# Patient Record
Sex: Female | Born: 1990 | Race: Black or African American | Hispanic: No | Marital: Single | Smoking: Never smoker
Health system: Southern US, Community
[De-identification: ages and names within clinical notes are randomized; demographics above are authoritative.]

## PROBLEM LIST (undated history)

## (undated) DIAGNOSIS — F32A Depression, unspecified: Secondary | ICD-10-CM

## (undated) DIAGNOSIS — F329 Major depressive disorder, single episode, unspecified: Secondary | ICD-10-CM

---
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
## 2009-03-04 ENCOUNTER — Emergency Department: Payer: Self-pay | Admitting: Emergency Medicine

## 2009-03-19 ENCOUNTER — Emergency Department: Payer: Self-pay | Admitting: Emergency Medicine

## 2009-03-21 ENCOUNTER — Emergency Department: Payer: Self-pay | Admitting: Emergency Medicine

## 2009-07-22 ENCOUNTER — Emergency Department: Payer: Self-pay | Admitting: Internal Medicine

## 2009-08-08 ENCOUNTER — Emergency Department: Payer: Self-pay | Admitting: Emergency Medicine

## 2009-09-28 ENCOUNTER — Emergency Department: Payer: Self-pay | Admitting: Emergency Medicine

## 2009-10-23 ENCOUNTER — Ambulatory Visit: Payer: Self-pay | Admitting: Family Medicine

## 2009-11-07 ENCOUNTER — Observation Stay: Payer: Self-pay | Admitting: Obstetrics and Gynecology

## 2009-11-15 ENCOUNTER — Observation Stay: Payer: Self-pay | Admitting: Obstetrics and Gynecology

## 2009-12-28 ENCOUNTER — Observation Stay: Payer: Self-pay | Admitting: Obstetrics and Gynecology

## 2010-01-31 ENCOUNTER — Inpatient Hospital Stay: Payer: Self-pay | Admitting: Obstetrics and Gynecology

## 2010-02-14 ENCOUNTER — Observation Stay: Payer: Self-pay | Admitting: Obstetrics and Gynecology

## 2010-02-16 ENCOUNTER — Inpatient Hospital Stay: Payer: Self-pay

## 2011-02-01 ENCOUNTER — Emergency Department: Payer: Self-pay | Admitting: Emergency Medicine

## 2011-10-23 ENCOUNTER — Emergency Department: Payer: Self-pay | Admitting: Emergency Medicine

## 2012-05-28 ENCOUNTER — Emergency Department: Payer: Self-pay | Admitting: Emergency Medicine

## 2012-05-28 LAB — CBC WITH DIFFERENTIAL/PLATELET
Basophil %: 0.4 %
Eosinophil #: 0 10*3/uL (ref 0.0–0.7)
Eosinophil %: 0.6 %
HGB: 11.6 g/dL — ABNORMAL LOW (ref 12.0–16.0)
Lymphocyte #: 0.9 10*3/uL — ABNORMAL LOW (ref 1.0–3.6)
MCH: 27.5 pg (ref 26.0–34.0)
MCHC: 31.8 g/dL — ABNORMAL LOW (ref 32.0–36.0)
MCV: 86 fL (ref 80–100)
Monocyte #: 0.3 x10 3/mm (ref 0.2–0.9)
Neutrophil #: 4.2 10*3/uL (ref 1.4–6.5)
RBC: 4.24 10*6/uL (ref 3.80–5.20)
WBC: 5.4 10*3/uL (ref 3.6–11.0)

## 2012-05-28 LAB — COMPREHENSIVE METABOLIC PANEL
Albumin: 3.3 g/dL — ABNORMAL LOW (ref 3.4–5.0)
Anion Gap: 5 — ABNORMAL LOW (ref 7–16)
BUN: 7 mg/dL (ref 7–18)
Calcium, Total: 8.9 mg/dL (ref 8.5–10.1)
EGFR (African American): >60
EGFR (Non-African Amer.): >60
Glucose: 100 mg/dL — ABNORMAL HIGH (ref 65–99)
Osmolality: 277 (ref 275–301)
Potassium: 4.1 mmol/L (ref 3.5–5.1)
SGOT(AST): 19 U/L (ref 15–37)
SGPT (ALT): 13 U/L (ref 12–78)
Total Protein: 7.1 g/dL (ref 6.4–8.2)

## 2012-05-28 LAB — URINALYSIS, COMPLETE
Glucose,UR: NEGATIVE mg/dL (ref 0–75)
Ketone: NEGATIVE
Nitrite: POSITIVE
Ph: 8 (ref 4.5–8.0)
Protein: NEGATIVE
RBC,UR: 1 /HPF (ref 0–5)
WBC UR: 4 /HPF (ref 0–5)

## 2012-05-28 LAB — PREGNANCY, URINE: Pregnancy Test, Urine: NEGATIVE m[IU]/mL

## 2012-10-12 ENCOUNTER — Emergency Department: Payer: Self-pay | Admitting: Emergency Medicine

## 2013-08-27 ENCOUNTER — Emergency Department: Payer: Self-pay | Admitting: Internal Medicine

## 2013-10-29 ENCOUNTER — Emergency Department: Payer: Self-pay | Admitting: Emergency Medicine

## 2013-10-29 LAB — COMPREHENSIVE METABOLIC PANEL
ALK PHOS: 68 U/L
AST: 18 U/L (ref 15–37)
Albumin: 3.5 g/dL (ref 3.4–5.0)
Anion Gap: 3 — ABNORMAL LOW (ref 7–16)
BILIRUBIN TOTAL: 0.4 mg/dL (ref 0.2–1.0)
BUN: 8 mg/dL (ref 7–18)
CREATININE: 0.89 mg/dL (ref 0.60–1.30)
Calcium, Total: 8.4 mg/dL — ABNORMAL LOW (ref 8.5–10.1)
Chloride: 109 mmol/L — ABNORMAL HIGH (ref 98–107)
Co2: 29 mmol/L (ref 21–32)
EGFR (Non-African Amer.): >60
GLUCOSE: 83 mg/dL (ref 65–99)
Osmolality: 279 (ref 275–301)
Potassium: 3.5 mmol/L (ref 3.5–5.1)
SGPT (ALT): 14 U/L (ref 12–78)
Sodium: 141 mmol/L (ref 136–145)
Total Protein: 7 g/dL (ref 6.4–8.2)

## 2013-10-29 LAB — URINALYSIS, COMPLETE
BILIRUBIN, UR: NEGATIVE
Glucose,UR: NEGATIVE mg/dL (ref 0–75)
Ketone: NEGATIVE
Nitrite: POSITIVE
PH: 5 (ref 4.5–8.0)
Protein: 30
SPECIFIC GRAVITY: 1.029 (ref 1.003–1.030)

## 2013-10-29 LAB — LIPASE, BLOOD: Lipase: 66 U/L — ABNORMAL LOW (ref 73–393)

## 2013-10-29 LAB — CBC WITH DIFFERENTIAL/PLATELET
BASOS ABS: 0 10*3/uL (ref 0.0–0.1)
BASOS PCT: 1 %
Eosinophil #: 0.1 10*3/uL (ref 0.0–0.7)
Eosinophil %: 2.4 %
HCT: 35.4 % (ref 35.0–47.0)
HGB: 11.5 g/dL — ABNORMAL LOW (ref 12.0–16.0)
LYMPHS ABS: 1.6 10*3/uL (ref 1.0–3.6)
Lymphocyte %: 33.5 %
MCH: 28.3 pg (ref 26.0–34.0)
MCHC: 32.5 g/dL (ref 32.0–36.0)
MCV: 87 fL (ref 80–100)
Monocyte #: 0.4 x10 3/mm (ref 0.2–0.9)
Monocyte %: 8.3 %
Neutrophil #: 2.6 10*3/uL (ref 1.4–6.5)
Neutrophil %: 54.8 %
Platelet: 194 10*3/uL (ref 150–440)
RBC: 4.07 10*6/uL (ref 3.80–5.20)
RDW: 13.3 % (ref 11.5–14.5)
WBC: 4.8 10*3/uL (ref 3.6–11.0)

## 2013-12-03 ENCOUNTER — Emergency Department: Payer: Self-pay | Admitting: Emergency Medicine

## 2014-04-19 ENCOUNTER — Emergency Department: Payer: Self-pay | Admitting: Emergency Medicine

## 2014-04-19 LAB — BASIC METABOLIC PANEL
Anion Gap: 7 (ref 7–16)
BUN: 8 mg/dL (ref 7–18)
CALCIUM: 8.6 mg/dL (ref 8.5–10.1)
Chloride: 107 mmol/L (ref 98–107)
Co2: 27 mmol/L (ref 21–32)
Creatinine: 0.96 mg/dL (ref 0.60–1.30)
EGFR (African American): >60
GLUCOSE: 79 mg/dL (ref 65–99)
Osmolality: 279 (ref 275–301)
Potassium: 3.6 mmol/L (ref 3.5–5.1)
Sodium: 141 mmol/L (ref 136–145)

## 2014-04-19 LAB — CBC WITH DIFFERENTIAL/PLATELET
BASOS ABS: 0 10*3/uL (ref 0.0–0.1)
Basophil %: 0.6 %
EOS ABS: 0.1 10*3/uL (ref 0.0–0.7)
EOS PCT: 1.4 %
HCT: 36.5 % (ref 35.0–47.0)
HGB: 11.7 g/dL — ABNORMAL LOW (ref 12.0–16.0)
LYMPHS ABS: 1.8 10*3/uL (ref 1.0–3.6)
Lymphocyte %: 34.8 %
MCH: 28 pg (ref 26.0–34.0)
MCHC: 32.1 g/dL (ref 32.0–36.0)
MCV: 87 fL (ref 80–100)
MONO ABS: 0.4 x10 3/mm (ref 0.2–0.9)
Monocyte %: 7.6 %
Neutrophil #: 2.9 10*3/uL (ref 1.4–6.5)
Neutrophil %: 55.6 %
PLATELETS: 194 10*3/uL (ref 150–440)
RBC: 4.18 10*6/uL (ref 3.80–5.20)
RDW: 12.8 % (ref 11.5–14.5)
WBC: 5.3 10*3/uL (ref 3.6–11.0)

## 2014-04-19 LAB — URINALYSIS, COMPLETE
Bilirubin,UR: NEGATIVE
Blood: NEGATIVE
GLUCOSE, UR: NEGATIVE mg/dL (ref 0–75)
Ketone: NEGATIVE
NITRITE: NEGATIVE
Ph: 7 (ref 4.5–8.0)
Protein: NEGATIVE
RBC,UR: 2 /HPF (ref 0–5)
Specific Gravity: 1.013 (ref 1.003–1.030)
WBC UR: 2 /HPF (ref 0–5)

## 2014-04-19 LAB — LIPASE, BLOOD: Lipase: 89 U/L (ref 73–393)

## 2014-04-19 LAB — WET PREP, GENITAL

## 2014-04-20 LAB — GC/CHLAMYDIA PROBE AMP

## 2014-11-03 ENCOUNTER — Emergency Department: Payer: Self-pay | Admitting: Emergency Medicine

## 2014-11-03 LAB — COMPREHENSIVE METABOLIC PANEL
ALK PHOS: 70 U/L (ref 46–116)
Albumin: 3.4 g/dL (ref 3.4–5.0)
Anion Gap: 6 — ABNORMAL LOW (ref 7–16)
BUN: 12 mg/dL (ref 7–18)
Bilirubin,Total: 0.3 mg/dL (ref 0.2–1.0)
CREATININE: 0.88 mg/dL (ref 0.60–1.30)
Calcium, Total: 8.5 mg/dL (ref 8.5–10.1)
Chloride: 107 mmol/L (ref 98–107)
Co2: 27 mmol/L (ref 21–32)
EGFR (African American): >60
EGFR (Non-African Amer.): >60
Glucose: 83 mg/dL (ref 65–99)
Osmolality: 278 (ref 275–301)
Potassium: 3.9 mmol/L (ref 3.5–5.1)
SGOT(AST): 20 U/L (ref 15–37)
SGPT (ALT): 11 U/L — ABNORMAL LOW (ref 14–63)
Sodium: 140 mmol/L (ref 136–145)
TOTAL PROTEIN: 7.2 g/dL (ref 6.4–8.2)

## 2014-11-03 LAB — CBC WITH DIFFERENTIAL/PLATELET
Basophil #: 0 10*3/uL (ref 0.0–0.1)
Basophil %: 0.6 %
Eosinophil #: 0 10*3/uL (ref 0.0–0.7)
Eosinophil %: 0.7 %
HCT: 37.8 % (ref 35.0–47.0)
HGB: 12 g/dL (ref 12.0–16.0)
LYMPHS ABS: 2 10*3/uL (ref 1.0–3.6)
Lymphocyte %: 36.5 %
MCH: 27.7 pg (ref 26.0–34.0)
MCHC: 31.7 g/dL — ABNORMAL LOW (ref 32.0–36.0)
MCV: 87 fL (ref 80–100)
MONOS PCT: 8.4 %
Monocyte #: 0.5 x10 3/mm (ref 0.2–0.9)
NEUTROS ABS: 3 10*3/uL (ref 1.4–6.5)
Neutrophil %: 53.8 %
Platelet: 196 10*3/uL (ref 150–440)
RBC: 4.34 10*6/uL (ref 3.80–5.20)
RDW: 13 % (ref 11.5–14.5)
WBC: 5.5 10*3/uL (ref 3.6–11.0)

## 2014-11-03 LAB — URINALYSIS, COMPLETE
BILIRUBIN, UR: NEGATIVE
Blood: NEGATIVE
GLUCOSE, UR: NEGATIVE mg/dL (ref 0–75)
Ketone: NEGATIVE
Nitrite: POSITIVE
Ph: 6 (ref 4.5–8.0)
Protein: NEGATIVE
RBC,UR: 5 /HPF (ref 0–5)
Specific Gravity: 1.028 (ref 1.003–1.030)
WBC UR: 14 /HPF (ref 0–5)

## 2014-12-07 ENCOUNTER — Emergency Department: Payer: Self-pay | Admitting: Emergency Medicine

## 2015-01-17 ENCOUNTER — Emergency Department: Admit: 2015-01-17 | Disposition: A | Discharge status: Home / Self Care | Payer: Self-pay | Admitting: Student

## 2015-01-17 LAB — COMPREHENSIVE METABOLIC PANEL
ALT: 11 U/L — AB
AST: 18 U/L
Albumin: 3.8 g/dL
Alkaline Phosphatase: 59 U/L
Anion Gap: 5 — ABNORMAL LOW (ref 7–16)
BUN: 12 mg/dL
Bilirubin,Total: 0.7 mg/dL
CALCIUM: 8.8 mg/dL — AB
CO2: 26 mmol/L
Chloride: 107 mmol/L
Creatinine: 0.81 mg/dL
EGFR (African American): >60
EGFR (Non-African Amer.): >60
GLUCOSE: 98 mg/dL
POTASSIUM: 4.1 mmol/L
SODIUM: 138 mmol/L
TOTAL PROTEIN: 7 g/dL

## 2015-01-17 LAB — URINALYSIS, COMPLETE
BILIRUBIN, UR: NEGATIVE
Blood: NEGATIVE
Glucose,UR: NEGATIVE mg/dL (ref 0–75)
Ketone: NEGATIVE
Nitrite: NEGATIVE
PH: 7 (ref 4.5–8.0)
PROTEIN: NEGATIVE
Specific Gravity: 1.024 (ref 1.003–1.030)

## 2015-01-17 LAB — WET PREP, GENITAL

## 2015-01-17 LAB — CBC
HCT: 38.6 % (ref 35.0–47.0)
HGB: 12.2 g/dL (ref 12.0–16.0)
MCH: 27.5 pg (ref 26.0–34.0)
MCHC: 31.7 g/dL — ABNORMAL LOW (ref 32.0–36.0)
MCV: 87 fL (ref 80–100)
Platelet: 186 10*3/uL (ref 150–440)
RBC: 4.45 10*6/uL (ref 3.80–5.20)
RDW: 13.2 % (ref 11.5–14.5)
WBC: 4 10*3/uL (ref 3.6–11.0)

## 2015-01-20 LAB — GC/CHLAMYDIA PROBE AMP

## 2015-10-29 ENCOUNTER — Emergency Department: Payer: Self-pay

## 2015-10-29 ENCOUNTER — Emergency Department
Admission: EM | Admit: 2015-10-29 | Discharge: 2015-10-30 | Disposition: A | Discharge status: Home / Self Care | Payer: Self-pay | Attending: Emergency Medicine | Admitting: Emergency Medicine

## 2015-10-29 DIAGNOSIS — R197 Diarrhea, unspecified: Secondary | ICD-10-CM | POA: Insufficient documentation

## 2015-10-29 DIAGNOSIS — N390 Urinary tract infection, site not specified: Secondary | ICD-10-CM | POA: Insufficient documentation

## 2015-10-29 DIAGNOSIS — R109 Unspecified abdominal pain: Secondary | ICD-10-CM | POA: Insufficient documentation

## 2015-10-29 DIAGNOSIS — R1084 Generalized abdominal pain: Secondary | ICD-10-CM

## 2015-10-29 DIAGNOSIS — R112 Nausea with vomiting, unspecified: Secondary | ICD-10-CM | POA: Insufficient documentation

## 2015-10-29 DIAGNOSIS — Z3202 Encounter for pregnancy test, result negative: Secondary | ICD-10-CM | POA: Insufficient documentation

## 2015-10-29 LAB — URINALYSIS COMPLETE WITH MICROSCOPIC (ARMC ONLY)
Bilirubin Urine: NEGATIVE
Glucose, UA: NEGATIVE mg/dL
Hgb urine dipstick: NEGATIVE
Nitrite: NEGATIVE
PROTEIN: 30 mg/dL — AB
Specific Gravity, Urine: 1.026 (ref 1.005–1.030)
pH: 7 (ref 5.0–8.0)

## 2015-10-29 LAB — TYPE AND SCREEN
ABO/RH(D): O POS
Antibody Screen: NEGATIVE

## 2015-10-29 LAB — COMPREHENSIVE METABOLIC PANEL
ALT: 10 U/L — AB (ref 14–54)
AST: 17 U/L (ref 15–41)
Albumin: 4.1 g/dL (ref 3.5–5.0)
Alkaline Phosphatase: 57 U/L (ref 38–126)
Anion gap: 5 (ref 5–15)
BUN: 10 mg/dL (ref 6–20)
CHLORIDE: 104 mmol/L (ref 101–111)
CO2: 27 mmol/L (ref 22–32)
Calcium: 8.8 mg/dL — ABNORMAL LOW (ref 8.9–10.3)
Creatinine, Ser: 0.84 mg/dL (ref 0.44–1.00)
GFR calc Af Amer: >60 mL/min (ref >60)
GFR calc non Af Amer: >60 mL/min (ref >60)
Glucose, Bld: 95 mg/dL (ref 65–99)
Potassium: 4 mmol/L (ref 3.5–5.1)
Sodium: 136 mmol/L (ref 135–145)
Total Bilirubin: 0.8 mg/dL (ref 0.3–1.2)
Total Protein: 7.6 g/dL (ref 6.5–8.1)

## 2015-10-29 LAB — CBC
HCT: 38.7 % (ref 35.0–47.0)
Hemoglobin: 12.8 g/dL (ref 12.0–16.0)
MCH: 27.8 pg (ref 26.0–34.0)
MCHC: 33 g/dL (ref 32.0–36.0)
MCV: 84.2 fL (ref 80.0–100.0)
PLATELETS: 236 10*3/uL (ref 150–440)
RBC: 4.59 MIL/uL (ref 3.80–5.20)
RDW: 13.4 % (ref 11.5–14.5)
WBC: 10.5 10*3/uL (ref 3.6–11.0)

## 2015-10-29 LAB — PREGNANCY, URINE: Preg Test, Ur: NEGATIVE

## 2015-10-29 LAB — ABO/RH: ABO/RH(D): O POS

## 2015-10-29 MED ORDER — ONDANSETRON 4 MG PO TBDP
ORAL_TABLET | ORAL | Status: AC
  Filled 2015-10-29: qty 1

## 2015-10-29 MED ORDER — ONDANSETRON HCL 4 MG/2ML IJ SOLN
4.0000 mg | Freq: Once | INTRAMUSCULAR | Status: AC
  Administered 2015-10-29: 4 mg via INTRAVENOUS
  Filled 2015-10-29: qty 2

## 2015-10-29 MED ORDER — ONDANSETRON 4 MG PO TBDP
4.0000 mg | ORAL_TABLET | Freq: Once | ORAL | Status: AC
  Administered 2015-10-29: 4 mg via ORAL

## 2015-10-29 MED ORDER — GI COCKTAIL ~~LOC~~: 30.0000 mL | Freq: Once | ORAL | Status: DC

## 2015-10-29 MED ORDER — OMEPRAZOLE 40 MG PO CPDR: 40.0000 mg | DELAYED_RELEASE_CAPSULE | Freq: Every day | ORAL | Status: DC

## 2015-10-29 MED ORDER — FAMOTIDINE IN NACL 20-0.9 MG/50ML-% IV SOLN
20.0000 mg | Freq: Once | INTRAVENOUS | Status: AC
  Administered 2015-10-29: 20 mg via INTRAVENOUS
  Filled 2015-10-29: qty 50

## 2015-10-29 MED ORDER — METOCLOPRAMIDE HCL 10 MG PO TABS: 10.0000 mg | ORAL_TABLET | Freq: Four times a day (QID) | ORAL | Status: DC | PRN

## 2015-10-29 MED ORDER — CEPHALEXIN 500 MG PO CAPS
500.0000 mg | ORAL_CAPSULE | Freq: Once | ORAL | Status: AC
  Administered 2015-10-30: 500 mg via ORAL
  Filled 2015-10-29: qty 1

## 2015-10-29 MED ORDER — SODIUM CHLORIDE 0.9 % IV BOLUS (SEPSIS)
1000.0000 mL | Freq: Once | INTRAVENOUS | Status: AC
  Administered 2015-10-29: 1000 mL via INTRAVENOUS

## 2015-10-29 MED ORDER — CEPHALEXIN 500 MG PO CAPS: 500.0000 mg | ORAL_CAPSULE | Freq: Three times a day (TID) | ORAL | Status: AC

## 2015-10-29 NOTE — ED Provider Notes (Signed)
[facility] Emergency Department Provider Note  ____________________________________________  Time seen: Approximately 1010 PM  I have reviewed the triage vital signs and the nursing notes.   HISTORY  Chief Complaint Hematemesis    HPI MIKYA DON is a 25 y.o. female without any chronic medical conditions who is presenting today with multiple episodes of vomiting as well as 2 episodes of diarrhea.  The patient says that she was having a strange, sick feeling at midday and then went home and vomited. After she vomited she began to have diffuse abdominal cramping without radiation. Says she continues to have diffuse abdominal cramping. Also with 3 days of burning upon urination. No history of urinary tract infections. Says also 2 episodes of diarrhea which were nonbloody. The last episode of vomiting she had she said that there were streaks of blood. She said that she did not vomit forcefully. She denies any chest pain at this time. Says that she does have some instances of heartburn in the past. Says that her sister's concerned that she may have a "ulcer." Denies any known sick contacts. Denies any fever today. Denies any recent antibiotics or hospitalizations.  Denies any associated cough or runny nose. Denies any blood in her stool. Says that she has vomited over 10 times today.   No past medical history on file.  There are no active problems to display for this patient.   No past surgical history on file.  No current outpatient prescriptions on file.  Allergies Review of patient's allergies indicates no known allergies.  No family history on file.  Social History Social History  Substance Use Topics  . Smoking status: Not on file  . Smokeless tobacco: Not on file  . Alcohol Use: Not on file    Review of Systems Constitutional: No fever/chills Eyes: No visual changes. ENT: No sore throat. Cardiovascular: Denies chest pain. Respiratory: Denies  shortness of breath. Gastrointestinal: No constipation. Genitourinary: Negative for dysuria. Musculoskeletal: Negative for back pain. Skin: Negative for rash. Neurological: Negative for headaches, focal weakness or numbness.  10-point ROS otherwise negative.  ____________________________________________   PHYSICAL EXAM:  VITAL SIGNS: ED Triage Vitals  Enc Vitals Group     BP 10/29/15 2030 94/59 mmHg     Pulse Rate 10/29/15 2030 92     Resp 10/29/15 2030 18     Temp 10/29/15 2030 98.2 F (36.8 C)     Temp Source 10/29/15 2030 Oral     SpO2 10/29/15 2030 100 %     Weight 10/29/15 2030 160 lb (72.576 kg)     Height 10/29/15 2030  (1.626 m)     Head Cir --      Peak Flow --      Pain Score 10/29/15 2031 10     Pain Loc --      Pain Edu? --      Excl. in GC? --     Constitutional: Alert and oriented. Well appearing and in no acute distress. Eyes: Conjunctivae are normal. PERRL. EOMI. Head: Atraumatic. Nose: No congestion/rhinnorhea. Mouth/Throat: Mucous membranes are moist.   Neck: No stridor.   Cardiovascular: Normal rate, regular rhythm. Grossly normal heart sounds.  Good peripheral circulation. Respiratory: Normal respiratory effort.  No retractions. Lungs CTAB. Gastrointestinal: Soft and nontender. No distention. No abdominal bruits. No CVA tenderness. Musculoskeletal: No lower extremity tenderness nor edema.  No joint effusions. Neurologic:  Normal speech and language. No gross focal neurologic deficits are appreciated. No gait instability.  Skin:  Skin is warm, dry and intact. No rash noted. Psychiatric: Mood and affect are normal. Speech and behavior are normal.  ____________________________________________   LABS (all labs ordered are listed, but only abnormal results are displayed)  Labs Reviewed  COMPREHENSIVE METABOLIC PANEL - Abnormal; Notable for the following:    Calcium 8.8 (*)    ALT 10 (*)    All other components within normal limits   URINALYSIS COMPLETEWITH MICROSCOPIC ([facility] ONLY) - Abnormal; Notable for the following:    Color, Urine YELLOW (*)    APPearance HAZY (*)    Ketones, ur 1+ (*)    Protein, ur 30 (*)    Leukocytes, UA 1+ (*)    Bacteria, UA RARE (*)    Squamous Epithelial / LPF 6-30 (*)    All other components within normal limits  URINE CULTURE  CBC  PREGNANCY, URINE  TYPE AND SCREEN  ABO/RH   ____________________________________________  EKG   ____________________________________________  RADIOLOGY  No acute cardiopulmonary process seen on chest x-ray. ____________________________________________   PROCEDURES   ____________________________________________   INITIAL IMPRESSION / ASSESSMENT AND PLAN / ED COURSE  Pertinent labs & imaging results that were available during my care of the patient were reviewed by me and considered in my medical decision making (see chart for details).  ----------------------------------------- 11:29 PM on 10/29/2015 -----------------------------------------  Patient resting comfortably at this time with no further episodes of vomiting.  Her IV is running at this time and she is receiving her medications. We'll treat for urinary tract infection. Likely viral etiology. Signed out to Dr. Zenda Alpers. ____________________________________________   FINAL CLINICAL IMPRESSION(S) / ED DIAGNOSES  Abdominal pain. Nausea vomiting and diarrhea.    Myrna Blazer, MD 10/29/15 [phone]

## 2015-10-29 NOTE — Discharge Instructions (Signed)

## 2015-10-29 NOTE — ED Notes (Signed)
Pt reports vomiting all day, states that she has seen blood in her vomit multiple times, pt also reports nausea as well as diarrhea

## 2015-10-30 MED ORDER — SODIUM CHLORIDE 0.9 % IV BOLUS (SEPSIS)
1000.0000 mL | Freq: Once | INTRAVENOUS | Status: AC
  Administered 2015-10-30: 1000 mL via INTRAVENOUS

## 2015-10-30 MED ORDER — METOCLOPRAMIDE HCL 5 MG/ML IJ SOLN
INTRAMUSCULAR | Status: AC
  Filled 2015-10-30: qty 2

## 2015-10-30 MED ORDER — METOCLOPRAMIDE HCL 5 MG/ML IJ SOLN
10.0000 mg | Freq: Once | INTRAMUSCULAR | Status: AC
  Administered 2015-10-30: 10 mg via INTRAVENOUS

## 2015-10-30 NOTE — ED Notes (Signed)
Pt up to urinate. 

## 2015-10-30 NOTE — ED Notes (Signed)
Pt has taken a few sips of water, will continue po challenge.

## 2015-10-30 NOTE — ED Notes (Signed)
Pt awoken from sleep to try po challenge.

## 2015-10-30 NOTE — ED Provider Notes (Signed)
-----------------------------------------   3:38 AM on 10/30/2015 -----------------------------------------   Blood pressure 100/54, pulse 72, temperature 98.2 F (36.8 C), temperature source Oral, resp. rate 12, height  (1.626 m), weight 160 lb (72.576 kg), last menstrual period 10/08/2015, SpO2 98 %.  Assuming care from Dr. Pershing Proud.  In short, Cathy Joseph is a 25 y.o. female with a chief complaint of Hematemesis .  Refer to the original H&P for additional details.  The current plan of care is to reassess the patient.  The patient after initial doses of medication did have an episode of emesis. I gave her a second liter of normal saline as well as some Reglan and she was able to keep down some fluids. It appears that the patient does have a UTI and she will receive her prescription for Keflex originally written by Dr. Pershing Proud. The patient will be discharged to home to follow-up with her primary care physician.   Rebecka Apley, MD 10/30/15 [phone]

## 2015-10-30 NOTE — ED Notes (Signed)
Pt vomiting, md notified.

## 2015-10-30 NOTE — ED Notes (Signed)
Pt sleeping. 

## 2015-10-31 LAB — URINE CULTURE

## 2015-12-24 ENCOUNTER — Encounter: Payer: Self-pay | Admitting: Emergency Medicine

## 2015-12-24 ENCOUNTER — Emergency Department
Admission: EM | Admit: 2015-12-24 | Discharge: 2015-12-24 | Disposition: A | Discharge status: Home / Self Care | Payer: Self-pay | Attending: Emergency Medicine | Admitting: Emergency Medicine

## 2015-12-24 DIAGNOSIS — S30860A Insect bite (nonvenomous) of lower back and pelvis, initial encounter: Secondary | ICD-10-CM | POA: Insufficient documentation

## 2015-12-24 DIAGNOSIS — Y999 Unspecified external cause status: Secondary | ICD-10-CM | POA: Insufficient documentation

## 2015-12-24 DIAGNOSIS — F329 Major depressive disorder, single episode, unspecified: Secondary | ICD-10-CM | POA: Insufficient documentation

## 2015-12-24 DIAGNOSIS — Y9259 Other trade areas as the place of occurrence of the external cause: Secondary | ICD-10-CM | POA: Insufficient documentation

## 2015-12-24 DIAGNOSIS — Y939 Activity, unspecified: Secondary | ICD-10-CM | POA: Insufficient documentation

## 2015-12-24 DIAGNOSIS — S30861A Insect bite (nonvenomous) of abdominal wall, initial encounter: Secondary | ICD-10-CM | POA: Insufficient documentation

## 2015-12-24 DIAGNOSIS — W57XXXA Bitten or stung by nonvenomous insect and other nonvenomous arthropods, initial encounter: Secondary | ICD-10-CM | POA: Insufficient documentation

## 2015-12-24 HISTORY — DX: Major depressive disorder, single episode, unspecified: F32.9

## 2015-12-24 HISTORY — DX: Depression, unspecified: F32.A

## 2015-12-24 MED ORDER — DIPHENHYDRAMINE-ZINC ACETATE 2-0.1 % EX STCK: CUTANEOUS | Status: DC

## 2015-12-24 NOTE — ED Notes (Signed)
Pt states was exposed to bed bugs, bitten and now has itching to areas of bites. Pt in no acute distress.

## 2015-12-24 NOTE — ED Provider Notes (Signed)
[facility] Emergency Department Provider Note  ____________________________________________   I have reviewed the triage vital signs and the nursing notes.   HISTORY  Chief Complaint Pruritis and Insect Bite    HPI Cathy Joseph is a 25 y.[facility]. [facility] that she had bed bugs on her. She was bitten she states on her abdomen and back and a few on her arms. She is now having itching. Multiple other people from the same hotel have been here for the same complaint today.  Past Medical History  Diagnosis Date  . Depression     There are no active problems to display for this patient.   History reviewed. No pertinent past surgical history.  Current Outpatient Rx  Name  Route  Sig  Dispense  Refill  . metoCLOPramide (REGLAN) 10 MG tablet   Oral   Take 1 tablet (10 mg total) by mouth every 6 (six) hours as needed for nausea or vomiting.   12 tablet   0   . omeprazole (PRILOSEC) 40 MG capsule   Oral   Take 1 capsule (40 mg total) by mouth daily.   30 capsule   0     Allergies Review of patient's allergies indicates no known allergies.  No family history on file.  Social History Social History  Substance Use Topics  . Smoking status: Never Smoker   . Smokeless tobacco: Never Used  . Alcohol Use: No    Review of Systems See history of present illness otherwise negative  ____________________________________________   PHYSICAL EXAM:  VITAL SIGNS: ED Triage Vitals  Enc Vitals Group     BP 12/24/15 0347 129/68 mmHg     Pulse Rate 12/24/15 0347 84     Resp 12/24/15 0347 16     Temp 12/24/15 0347 98.2 F (36.8 C)     Temp Source 12/24/15 0347 Oral     SpO2 12/24/15 0347 100 %     Weight 12/24/15 0347 190 lb (86.183 kg)     Height 12/24/15 0347 5\' 3"  (1.6 m)     Head Cir --      Peak Flow --      Pain Score --      Pain Loc --      Pain Edu? --      Excl. in GC? --     Constitutional: Alert and  oriented. Well appearing and in no acute distress. Eyes: Conjunctivae are normal. PERRL. EOMI.  Nose: No congestion/rhinnorhea. Mouth/Throat: Mucous membranes are moist.  Oropharynx non-erythematous. Neck: No stridor.   Nontender with no meningismus Respiratory: Normal respiratory effort.  No retractions. Lungs CTAB. Skin:  Skin is warm, dry and intact. Small raised. It areas on abdomen and back and to some extent arms noted, consistent with insect bites. Blanchable. Psychiatric: Mood and affect are normal. Speech and behavior are normal.  ____________________________________________   LABS (all labs ordered are listed, but only abnormal results are displayed)  Labs Reviewed - No data to display ____________________________________________  EKG  I personally interpreted any EKGs ordered by me or triage  ____________________________________________  RADIOLOGY  I reviewed any imaging ordered by me or triage that were performed during my shift and, if possible, patient and/or family made aware of any abnormal findings. ____________________________________________   PROCEDURES  Procedure(s) performed: None  Critical Care performed: None  ____________________________________________   INITIAL IMPRESSION / ASSESSMENT AND PLAN / ED COURSE  Pertinent labs & imaging  results that were available during my care of the patient were reviewed by me and considered in my medical decision making (see chart for details).  A sheet with bed bug exposure and likely bites. No evidence of anaphylaxis or other acute pathology such as Stevens-Johnson's. Supportive care indicated. Return precautions given. ____________________________________________   FINAL CLINICAL IMPRESSION(S) / ED DIAGNOSES  Final diagnoses:  None      This chart was dictated using voice recognition software.  Despite best efforts to proofread,  errors can occur which can change meaning.     Jeanmarie Plant,  MD 12/24/15 [phone]

## 2015-12-24 NOTE — ED Notes (Signed)
Pt states felt "something crawling on me", woke up and found bed bugs crawling across her legs and abd. Pt itching to abd wall noted.

## 2016-07-24 ENCOUNTER — Encounter: Payer: Self-pay | Admitting: Emergency Medicine

## 2016-07-24 ENCOUNTER — Emergency Department: Payer: Self-pay

## 2016-07-24 DIAGNOSIS — M7042 Prepatellar bursitis, left knee: Secondary | ICD-10-CM | POA: Insufficient documentation

## 2016-07-24 DIAGNOSIS — Z79899 Other long term (current) drug therapy: Secondary | ICD-10-CM | POA: Insufficient documentation

## 2016-07-24 DIAGNOSIS — Y939 Activity, unspecified: Secondary | ICD-10-CM | POA: Insufficient documentation

## 2016-07-24 NOTE — ED Triage Notes (Signed)
Patient ambulatory to triage with steady gait, without difficulty or distress noted; Pt reports left knee x 1-2 weeks with no known injury

## 2016-07-25 ENCOUNTER — Emergency Department
Admission: EM | Admit: 2016-07-25 | Discharge: 2016-07-25 | Disposition: A | Discharge status: Home / Self Care | Payer: Self-pay | Attending: Emergency Medicine | Admitting: Emergency Medicine

## 2016-07-25 DIAGNOSIS — M7052 Other bursitis of knee, left knee: Secondary | ICD-10-CM

## 2016-07-25 DIAGNOSIS — M25562 Pain in left knee: Secondary | ICD-10-CM

## 2016-07-25 MED ORDER — HYDROCODONE-ACETAMINOPHEN 5-325 MG PO TABS
1.0000 | ORAL_TABLET | Freq: Once | ORAL | Status: AC
  Administered 2016-07-25: 1 via ORAL
  Filled 2016-07-25: qty 1

## 2016-07-25 MED ORDER — HYDROCODONE-ACETAMINOPHEN 5-325 MG PO TABS: 1.0000 | ORAL_TABLET | Freq: Four times a day (QID) | ORAL | 0 refills | Status: DC | PRN

## 2016-07-25 MED ORDER — IBUPROFEN 800 MG PO TABS: 800.0000 mg | ORAL_TABLET | Freq: Three times a day (TID) | ORAL | 0 refills | Status: DC | PRN

## 2016-07-25 MED ORDER — KETOROLAC TROMETHAMINE 30 MG/ML IJ SOLN
60.0000 mg | Freq: Once | INTRAMUSCULAR | Status: AC
  Administered 2016-07-25: 60 mg via INTRAMUSCULAR
  Filled 2016-07-25: qty 2

## 2016-07-25 NOTE — ED Provider Notes (Signed)
[facility] Emergency Department Provider Note   ____________________________________________   First MD Initiated Contact with Patient 07/25/16 0128     (approximate)  I have reviewed the triage vital signs and the nursing notes.   HISTORY  Chief Complaint Knee Pain    HPI Cathy Joseph is a 25 y.o. female who presents to the ED from home with a chief complaint of left knee pain. Patient reports nontraumatic left knee pain x 2-3 weeks. Presents this morning secondary to increased pain. Denies associated fever, chills, chest pain, shortness of breath, abdominal pain, nausea, vomiting, diarrhea, numbness, tingling, extremity weakness. Denies recent travel or trauma. Nothing makes her pain better. Movement makes her pain worse.   Past Medical History:  Diagnosis Date  . Depression     There are no active problems to display for this patient.   No past surgical history on file.  Prior to Admission medications   Medication Sig Start Date End Date Taking? Authorizing Provider  Diphenhydramine-Zinc Acetate (BENADRYL ITCH RELIEF STICK) 2-0.1 % STCK Apply to areas of concern twice a day as needed 12/24/15   Jeanmarie PlantJames A McShane, MD  HYDROcodone-acetaminophen (NORCO) 5-325 MG tablet Take 1 tablet by mouth every 6 (six) hours as needed for moderate pain. 07/25/16   Irean HongJade J Sung, MD  ibuprofen (ADVIL,MOTRIN) 800 MG tablet Take 1 tablet (800 mg total) by mouth every 8 (eight) hours as needed for moderate pain. 07/25/16   Irean HongJade J Sung, MD  metoCLOPramide (REGLAN) 10 MG tablet Take 1 tablet (10 mg total) by mouth every 6 (six) hours as needed for nausea or vomiting. 10/29/15   Myrna Blazeravid Matthew Schaevitz, MD  omeprazole (PRILOSEC) 40 MG capsule Take 1 capsule (40 mg total) by mouth daily. 10/29/15 10/28/16  Myrna Blazeravid Matthew Schaevitz, MD    Allergies Review of patient's allergies indicates no known allergies.  No family history on file.  Social History Social History    Substance Use Topics  . Smoking status: Never Smoker  . Smokeless tobacco: Never Used  . Alcohol use No    Review of Systems  Constitutional: No fever/chills. Eyes: No visual changes. ENT: No sore throat. Cardiovascular: Denies chest pain. Respiratory: Denies shortness of breath. Gastrointestinal: No abdominal pain.  No nausea, no vomiting.  No diarrhea.  No constipation. Genitourinary: Negative for dysuria. Musculoskeletal: Positive for knee pain. Negative for back pain. Skin: Negative for rash. Neurological: Negative for headaches, focal weakness or numbness.  10-point ROS otherwise negative.  ____________________________________________   PHYSICAL EXAM:  VITAL SIGNS: ED Triage Vitals  Enc Vitals Group     BP 07/24/16 2241 113/66     Pulse Rate 07/24/16 2241 72     Resp 07/24/16 2241 18     Temp 07/24/16 2241 98.2 F (36.8 C)     Temp Source 07/24/16 2241 Oral     SpO2 07/24/16 2241 100 %     Weight 07/24/16 2240 269 lb (122 kg)     Height 07/24/16 2240 5\' 1"  (1.549 m)     Head Circumference --      Peak Flow --      Pain Score 07/24/16 2239 10     Pain Loc --      Pain Edu? --      Excl. in GC? --     Constitutional: Asleep, awakened for exam. Alert and oriented. Well appearing and in no acute distress. Eyes: Conjunctivae are normal. PERRL. EOMI. Head: Atraumatic. Nose: No congestion/rhinnorhea. Mouth/Throat: Mucous  membranes are moist.  Oropharynx non-erythematous. Neck: No stridor.   Cardiovascular: Normal rate, regular rhythm. Grossly normal heart sounds.  Good peripheral circulation. Respiratory: Normal respiratory effort.  No retractions. Lungs CTAB. Gastrointestinal: Soft and nontender. No distention. No abdominal bruits. No CVA tenderness. Musculoskeletal: Left lateral knee tender to palpation. Limited range of motion secondary to pain. Mild to moderate joint effusion. There is no warmth or erythema to suggest septic joint. Calf is supple without  evidence for compartment syndrome. Limb is symmetrically warm without evidence for ischemia. 2+ distal pulses. Brisk, less than 5 second capillary refill. Neurologic:  Normal speech and language. No gross focal neurologic deficits are appreciated.  Skin:  Skin is warm, dry and intact. No rash noted. Psychiatric: Mood and affect are normal. Speech and behavior are normal.  ____________________________________________   LABS (all labs ordered are listed, but only abnormal results are displayed)  Labs Reviewed - No data to display ____________________________________________  EKG  None ____________________________________________  RADIOLOGY  Left knee x-rays (reviewed by me, interpreted per Dr. Gwenyth Bender): Negative. ____________________________________________   PROCEDURES  Procedure(s) performed: None  Procedures  Critical Care performed: No  ____________________________________________   INITIAL IMPRESSION / ASSESSMENT AND PLAN / ED COURSE  Pertinent labs & imaging results that were available during my care of the patient were reviewed by me and considered in my medical decision making (see chart for details).  25 year old female who presents with a 2-3 week history of nontraumatic left knee pain. Knee is swollen with effusion; clinically most consistent with bursitis. Will apply Ace wrap, crutches, NSAIDs, analgesia and encouraged patient to follow-up with orthopedics next week. Strict return precautions given. Patient verbalizes understanding and agrees with plan of care.  Clinical Course     ____________________________________________   FINAL CLINICAL IMPRESSION(S) / ED DIAGNOSES  Final diagnoses:  Acute pain of left knee  Suprapatellar bursitis of left knee      NEW MEDICATIONS STARTED DURING THIS VISIT:  New Prescriptions   HYDROCODONE-ACETAMINOPHEN (NORCO) 5-325 MG TABLET    Take 1 tablet by mouth every 6 (six) hours as needed for moderate pain.    IBUPROFEN (ADVIL,MOTRIN) 800 MG TABLET    Take 1 tablet (800 mg total) by mouth every 8 (eight) hours as needed for moderate pain.     Note:  This document was prepared using Dragon voice recognition software and may include unintentional dictation errors.    Irean Hong, MD 07/25/16 [phone]

## 2016-07-25 NOTE — Discharge Instructions (Signed)
1. You may take pain medicines as needed (Motrin/Norco #15). 2. Use crutches to walk. You may remove Ace wrap to bathe and sleep. Elevate affected area several times daily. 3. Return to the ER for worsening symptoms, persistent vomiting, difficulty breathing or other concerns.

## 2016-10-03 ENCOUNTER — Emergency Department
Admission: EM | Admit: 2016-10-03 | Discharge: 2016-10-03 | Disposition: A | Discharge status: Home / Self Care | Payer: Medicaid Other | Attending: Emergency Medicine | Admitting: Emergency Medicine

## 2016-10-03 DIAGNOSIS — Z791 Long term (current) use of non-steroidal anti-inflammatories (NSAID): Secondary | ICD-10-CM | POA: Diagnosis not present

## 2016-10-03 DIAGNOSIS — Z79899 Other long term (current) drug therapy: Secondary | ICD-10-CM | POA: Insufficient documentation

## 2016-10-03 DIAGNOSIS — J029 Acute pharyngitis, unspecified: Secondary | ICD-10-CM | POA: Diagnosis present

## 2016-10-03 DIAGNOSIS — J329 Chronic sinusitis, unspecified: Secondary | ICD-10-CM | POA: Insufficient documentation

## 2016-10-03 DIAGNOSIS — B9689 Other specified bacterial agents as the cause of diseases classified elsewhere: Secondary | ICD-10-CM

## 2016-10-03 MED ORDER — AMOXICILLIN-POT CLAVULANATE 875-125 MG PO TABS: 1.0000 | ORAL_TABLET | Freq: Two times a day (BID) | ORAL | 0 refills | Status: DC

## 2016-10-03 MED ORDER — CETIRIZINE HCL 10 MG PO TABS
10.0000 mg | ORAL_TABLET | Freq: Every day | ORAL | 0 refills | Status: DC
Start: 2016-10-03 — End: 2017-01-13

## 2016-10-03 MED ORDER — FLUTICASONE PROPIONATE 50 MCG/ACT NA SUSP: 1.0000 | Freq: Two times a day (BID) | NASAL | 0 refills | Status: DC

## 2016-10-03 MED ORDER — PSEUDOEPH-BROMPHEN-DM 30-2-10 MG/5ML PO SYRP: 10.0000 mL | ORAL_SOLUTION | Freq: Four times a day (QID) | ORAL | 0 refills | Status: DC | PRN

## 2016-10-03 NOTE — ED Provider Notes (Signed)
[facility] Emergency Department Provider Note  ____________________________________________  Time seen: Approximately 4:47 PM  I have reviewed the triage vital signs and the nursing notes.   HISTORY  Chief Complaint Sore Throat    HPI Cathy Joseph is a 25 y.o. female who presents emergency department complaining of nasal congestion, sinus pressure, sinus headache, sore throat, cough 2 weeks. Patient states that symptoms began insidiously and have persisted. Patient is not taking any medications including over-the-counter medications for this complaint prior to arrival. Patient reports significant pressure in the maxillary and frontal sinus region. She denies any ear pain, chest pain, shortness of breath, abdominal pain, nausea or vomiting.   Past Medical History:  Diagnosis Date  . Depression     There are no active problems to display for this patient.   History reviewed. No pertinent surgical history.  Prior to Admission medications   Medication Sig Start Date End Date Taking? Authorizing Provider  amoxicillin-clavulanate (AUGMENTIN) 875-125 MG tablet Take 1 tablet by mouth 2 (two) times daily. 10/03/16   Delorise Royals Ac Colan, PA-C  brompheniramine-pseudoephedrine-DM 30-2-10 MG/5ML syrup Take 10 mLs by mouth 4 (four) times daily as needed. 10/03/16   Delorise Royals Doratha Mcswain, PA-C  cetirizine (ZYRTEC) 10 MG tablet Take 1 tablet (10 mg total) by mouth daily. 10/03/16   Delorise Royals Duwan Adrian, PA-C  Diphenhydramine-Zinc Acetate (BENADRYL ITCH RELIEF STICK) 2-0.1 % STCK Apply to areas of concern twice a day as needed 12/24/15   Jeanmarie Plant, MD  fluticasone (FLONASE) 50 MCG/ACT nasal spray Place 1 spray into both nostrils 2 (two) times daily. 10/03/16   Delorise Royals Dezerae Freiberger, PA-C  HYDROcodone-acetaminophen (NORCO) 5-325 MG tablet Take 1 tablet by mouth every 6 (six) hours as needed for moderate pain. 07/25/16   Irean Hong, MD  ibuprofen (ADVIL,MOTRIN) 800  MG tablet Take 1 tablet (800 mg total) by mouth every 8 (eight) hours as needed for moderate pain. 07/25/16   Irean Hong, MD  metoCLOPramide (REGLAN) 10 MG tablet Take 1 tablet (10 mg total) by mouth every 6 (six) hours as needed for nausea or vomiting. 10/29/15   Myrna Blazer, MD  omeprazole (PRILOSEC) 40 MG capsule Take 1 capsule (40 mg total) by mouth daily. 10/29/15 10/28/16  Myrna Blazer, MD    Allergies Patient has no known allergies.  No family history on file.  Social History Social History  Substance Use Topics  . Smoking status: Never Smoker  . Smokeless tobacco: Never Used  . Alcohol use No     Review of Systems  Constitutional: No fever/chills Eyes: No visual changes. No discharge ENT: Positive for nasal congestion and sore throat. Positive for sinus pressure. Cardiovascular: no chest pain. Respiratory: Positive cough. No SOB. Gastrointestinal: No abdominal pain.  No nausea, no vomiting.   Musculoskeletal: Negative for musculoskeletal pain. Skin: Negative for rash, abrasions, lacerations, ecchymosis. Neurological: Was a sinus headache but denies focal weakness or numbness. 10-point ROS otherwise negative.  ____________________________________________   PHYSICAL EXAM:  VITAL SIGNS: ED Triage Vitals  Enc Vitals Group     BP 10/03/16 1622 121/75     Pulse Rate 10/03/16 1622 83     Resp 10/03/16 1622 18     Temp 10/03/16 1622 97.7 F (36.5 C)     Temp Source 10/03/16 1622 Oral     SpO2 10/03/16 1622 98 %     Weight 10/03/16 1623 180 lb (81.6 kg)     Height 10/03/16 1623 5\' 2"  (  1.575 m)     Head Circumference --      Peak Flow --      Pain Score 10/03/16 1623 10     Pain Loc --      Pain Edu? --      Excl. in GC? --      Constitutional: Alert and oriented. Well appearing and in no acute distress. Eyes: Conjunctivae are normal. PERRL. EOMI. Head: Atraumatic. ENT:      Ears: EACs are unremarkable bilaterally. TMs are mildly bulging  bilaterally. No air-fluid level.      Nose: Moderate purulent congestion/rhinnorhea. Turbinates are erythematous and edematous. Patient is very tender to percussion over bilateral frontal and maxillary sinuses.      Mouth/Throat: Mucous membranes are moist. Oropharynx is mildly erythematous with postnasal drip identified. Uvula is midline. Tonsils are unremarkable bilaterally. Neck: No stridor. Neck is supple with full range of motion Hematological/Lymphatic/Immunilogical: Diffuse, mobile, nontender anterior cervical lymphadenopathy. Cardiovascular: Normal rate, regular rhythm. Normal S1 and S2.  Good peripheral circulation. Respiratory: Normal respiratory effort without tachypnea or retractions. Lungs CTAB. Good air entry to the bases with no decreased or absent breath sounds. Musculoskeletal: Full range of motion to all extremities. No gross deformities appreciated. Neurologic:  Normal speech and language. No gross focal neurologic deficits are appreciated.  Skin:  Skin is warm, dry and intact. No rash noted. Psychiatric: Mood and affect are normal. Speech and behavior are normal. Patient exhibits appropriate insight and judgement.   ____________________________________________   LABS (all labs ordered are listed, but only abnormal results are displayed)  Labs Reviewed - No data to display ____________________________________________  EKG   ____________________________________________  RADIOLOGY   No results found.  ____________________________________________    PROCEDURES  Procedure(s) performed:    Procedures    Medications - No data to display   ____________________________________________   INITIAL IMPRESSION / ASSESSMENT AND PLAN / ED COURSE  Pertinent labs & imaging results that were available during my care of the patient were reviewed by me and considered in my medical decision making (see chart for details).  Review of the [place] CSRS was performed in  accordance of the [facility] prior to dispensing any controlled drugs.  Clinical Course     Patient's diagnosis is consistent with Bacterial sinusitis. No indication for labs or imaging at this time.. Patient will be discharged home with prescriptions for antibiotics, Flonase, Zyrtec, cough syrup. Patient is to follow up with primary care as needed or otherwise directed. Patient is given ED precautions to return to the ED for any worsening or new symptoms.     ____________________________________________  FINAL CLINICAL IMPRESSION(S) / ED DIAGNOSES  Final diagnoses:  Bacterial sinusitis      NEW MEDICATIONS STARTED DURING THIS VISIT:  New Prescriptions   AMOXICILLIN-CLAVULANATE (AUGMENTIN) 875-125 MG TABLET    Take 1 tablet by mouth 2 (two) times daily.   BROMPHENIRAMINE-PSEUDOEPHEDRINE-DM 30-2-10 MG/5ML SYRUP    Take 10 mLs by mouth 4 (four) times daily as needed.   CETIRIZINE (ZYRTEC) 10 MG TABLET    Take 1 tablet (10 mg total) by mouth daily.   FLUTICASONE (FLONASE) 50 MCG/ACT NASAL SPRAY    Place 1 spray into both nostrils 2 (two) times daily.        This chart was dictated using voice recognition software/Dragon. Despite best efforts to proofread, errors can occur which can change the meaning. Any change was purely unintentional.    Racheal PatchesJonathan D Teigan Sahli, PA-C 10/03/16 1702    Onalee Huaavid  Achilles DunkHsienta Yao, MD 10/04/16 [phone]

## 2016-10-03 NOTE — ED Triage Notes (Signed)
Pt c/o sore throat with sinus pain/pressure for the past week

## 2016-10-03 NOTE — ED Notes (Signed)
Pt in via triage with complaints of sinus pain/pressure, congestion, cough, and sore throat x "2-3 weeks."  Pt reports taking nyquil and robitussin at home with out any relief.  Pt denies any fevers.  Pt ambulatory to room, no immediate distress at this time.

## 2016-11-11 ENCOUNTER — Emergency Department
Admission: EM | Admit: 2016-11-11 | Discharge: 2016-11-12 | Disposition: A | Discharge status: Home / Self Care | Payer: Self-pay | Attending: Emergency Medicine | Admitting: Emergency Medicine

## 2016-11-11 ENCOUNTER — Emergency Department: Payer: Self-pay

## 2016-11-11 DIAGNOSIS — R1012 Left upper quadrant pain: Secondary | ICD-10-CM

## 2016-11-11 DIAGNOSIS — K5792 Diverticulitis of intestine, part unspecified, without perforation or abscess without bleeding: Secondary | ICD-10-CM | POA: Insufficient documentation

## 2016-11-11 DIAGNOSIS — K529 Noninfective gastroenteritis and colitis, unspecified: Secondary | ICD-10-CM | POA: Insufficient documentation

## 2016-11-11 DIAGNOSIS — R197 Diarrhea, unspecified: Secondary | ICD-10-CM

## 2016-11-11 DIAGNOSIS — R1110 Vomiting, unspecified: Secondary | ICD-10-CM

## 2016-11-11 LAB — CBC
HCT: 36.6 % (ref 35.0–47.0)
HEMOGLOBIN: 12.1 g/dL (ref 12.0–16.0)
MCH: 27.9 pg (ref 26.0–34.0)
MCHC: 33 g/dL (ref 32.0–36.0)
MCV: 84.6 fL (ref 80.0–100.0)
Platelets: 208 10*3/uL (ref 150–440)
RBC: 4.32 MIL/uL (ref 3.80–5.20)
RDW: 13 % (ref 11.5–14.5)
WBC: 7.7 10*3/uL (ref 3.6–11.0)

## 2016-11-11 LAB — URINALYSIS, COMPLETE (UACMP) WITH MICROSCOPIC
Bilirubin Urine: NEGATIVE
GLUCOSE, UA: NEGATIVE mg/dL
Hgb urine dipstick: NEGATIVE
KETONES UR: 5 mg/dL — AB
Nitrite: NEGATIVE
PROTEIN: 30 mg/dL — AB
Specific Gravity, Urine: 1.029 (ref 1.005–1.030)
pH: 6 (ref 5.0–8.0)

## 2016-11-11 LAB — COMPREHENSIVE METABOLIC PANEL
ALBUMIN: 3.9 g/dL (ref 3.5–5.0)
ALK PHOS: 67 U/L (ref 38–126)
ALT: 24 U/L (ref 14–54)
ANION GAP: 6 (ref 5–15)
AST: 32 U/L (ref 15–41)
BUN: 12 mg/dL (ref 6–20)
CALCIUM: 8.7 mg/dL — AB (ref 8.9–10.3)
CHLORIDE: 103 mmol/L (ref 101–111)
CO2: 27 mmol/L (ref 22–32)
CREATININE: 0.95 mg/dL (ref 0.44–1.00)
GFR calc Af Amer: >60 mL/min (ref >60)
GFR calc non Af Amer: >60 mL/min (ref >60)
GLUCOSE: 94 mg/dL (ref 65–99)
Potassium: 3.8 mmol/L (ref 3.5–5.1)
SODIUM: 136 mmol/L (ref 135–145)
Total Bilirubin: 0.6 mg/dL (ref 0.3–1.2)
Total Protein: 7.9 g/dL (ref 6.5–8.1)

## 2016-11-11 LAB — LIPASE, BLOOD: Lipase: 17 U/L (ref 11–51)

## 2016-11-11 LAB — POCT PREGNANCY, URINE: Preg Test, Ur: NEGATIVE

## 2016-11-11 MED ORDER — MORPHINE SULFATE (PF) 4 MG/ML IV SOLN
INTRAVENOUS | Status: AC
  Administered 2016-11-11: 4 mg via INTRAVENOUS
  Filled 2016-11-11: qty 1

## 2016-11-11 MED ORDER — ONDANSETRON HCL 4 MG/2ML IJ SOLN
INTRAMUSCULAR | Status: AC
  Administered 2016-11-11: 4 mg via INTRAVENOUS
  Filled 2016-11-11: qty 2

## 2016-11-11 MED ORDER — PROMETHAZINE HCL 25 MG/ML IJ SOLN
INTRAMUSCULAR | Status: AC
  Administered 2016-11-11: 12.5 mg via INTRAMUSCULAR
  Filled 2016-11-11: qty 1

## 2016-11-11 MED ORDER — IOPAMIDOL (ISOVUE-300) INJECTION 61%
30.0000 mL | Freq: Once | INTRAVENOUS | Status: AC
Start: 2016-11-11 — End: 2016-11-11
  Administered 2016-11-11: 30 mL via ORAL
  Filled 2016-11-11: qty 30

## 2016-11-11 MED ORDER — ONDANSETRON HCL 4 MG/2ML IJ SOLN
4.0000 mg | Freq: Once | INTRAMUSCULAR | Status: AC
  Administered 2016-11-11: 4 mg via INTRAVENOUS

## 2016-11-11 MED ORDER — SODIUM CHLORIDE 0.9 % IV BOLUS (SEPSIS)
1000.0000 mL | Freq: Once | INTRAVENOUS | Status: AC
Start: 2016-11-11 — End: 2016-11-12
  Administered 2016-11-11: 1000 mL via INTRAVENOUS

## 2016-11-11 MED ORDER — PROMETHAZINE HCL 25 MG/ML IJ SOLN
12.5000 mg | Freq: Once | INTRAMUSCULAR | Status: AC
  Administered 2016-11-11: 12.5 mg via INTRAMUSCULAR

## 2016-11-11 MED ORDER — IOPAMIDOL (ISOVUE-300) INJECTION 61%
100.0000 mL | Freq: Once | INTRAVENOUS | Status: AC | PRN
  Administered 2016-11-12: 100 mL via INTRAVENOUS
  Filled 2016-11-11: qty 100

## 2016-11-11 MED ORDER — MORPHINE SULFATE (PF) 4 MG/ML IV SOLN
4.0000 mg | Freq: Once | INTRAVENOUS | Status: AC
  Administered 2016-11-11: 4 mg via INTRAVENOUS

## 2016-11-11 NOTE — ED Triage Notes (Signed)
Pt started yesterday having lower abd pain with nausea and diarrhea (5-6 loose stools in 24 hours) - pain radiates into back - pt denies pain or frequency with urination

## 2016-11-11 NOTE — ED Notes (Signed)
Pt vomited a large amount of emesis. Pt had drank approx 1.5 bottles of 2 bottles given for CT.

## 2016-11-11 NOTE — ED Provider Notes (Signed)
[facility] Emergency Department Provider Note   ____________________________________________   First MD Initiated Contact with Patient 11/11/16 2217     (approximate)  I have reviewed the triage vital signs and the nursing notes.   HISTORY  Chief Complaint Abdominal Pain    HPI [name] is a 26 y.o. female reports she's been having severe episodes of crampy pain over the mid to left side of the abdomen with nausea and vomiting since yesterday. No fever. Reports she's been having episodes where she'll begin developing crampy pain, that seems to worsen and become very severe to the point she is in tears off and on for the last day and a half. She deniesany significant previous medical history.  At about 5-6 loose, nonbloody stools the last day. Denies any recent travel. No vaginal symptoms. No vaginal discharge. Denies pregnancy. No vaginal bleeding.  No right-sided pain.  No chest pain or shortness of breath.   Past Medical History:  Diagnosis Date  . Depression     There are no active problems to display for this patient.   History reviewed. No pertinent surgical history.  Prior to Admission medications   Medication Sig Start Date End Date Taking? Authorizing Provider  amoxicillin-clavulanate (AUGMENTIN) 875-125 MG tablet Take 1 tablet by mouth 2 (two) times daily. 10/03/16   Delorise Royals Cuthriell, PA-C  brompheniramine-pseudoephedrine-DM 30-2-10 MG/5ML syrup Take 10 mLs by mouth 4 (four) times daily as needed. 10/03/16   Delorise Royals Cuthriell, PA-C  cetirizine (ZYRTEC) 10 MG tablet Take 1 tablet (10 mg total) by mouth daily. 10/03/16   Delorise Royals Cuthriell, PA-C  Diphenhydramine-Zinc Acetate (BENADRYL ITCH RELIEF STICK) 2-0.1 % STCK Apply to areas of concern twice a day as needed 12/24/15   Jeanmarie Plant, MD  fluticasone (FLONASE) 50 MCG/ACT nasal spray Place 1 spray into both nostrils 2 (two) times daily. 10/03/16   Delorise Royals Cuthriell,  PA-C  HYDROcodone-acetaminophen (NORCO) 5-325 MG tablet Take 1 tablet by mouth every 6 (six) hours as needed for moderate pain. 07/25/16   Irean Hong, MD  ibuprofen (ADVIL,MOTRIN) 800 MG tablet Take 1 tablet (800 mg total) by mouth every 8 (eight) hours as needed for moderate pain. 07/25/16   Irean Hong, MD  metoCLOPramide (REGLAN) 10 MG tablet Take 1 tablet (10 mg total) by mouth every 6 (six) hours as needed for nausea or vomiting. 10/29/15   Myrna Blazer, MD  omeprazole (PRILOSEC) 40 MG capsule Take 1 capsule (40 mg total) by mouth daily. 10/29/15 10/28/16  Myrna Blazer, MD    Allergies Patient has no known allergies.  No family history on file.  Social History Social History  Substance Use Topics  . Smoking status: Never Smoker  . Smokeless tobacco: Never Used  . Alcohol use No    Review of Systems Constitutional: No fever/chills Eyes: No visual changes. ENT: No sore throat. Cardiovascular: Denies chest pain. Respiratory: Denies shortness of breath. Gastrointestinal: No constipation. Genitourinary: Negative for dysuria. Musculoskeletal: When she has severe episodes of pain, it seems to radiate towards her back, particularly on the left side Skin: Negative for rash. Neurological: Negative for headaches, focal weakness or numbness.  10-point ROS otherwise negative.  ____________________________________________   PHYSICAL EXAM:  VITAL SIGNS: ED Triage Vitals  Enc Vitals Group     BP 11/11/16 2121 115/67     Pulse Rate 11/11/16 2121 84     Resp 11/11/16 2121 16     Temp 11/11/16  2121 98.2 F (36.8 C)     Temp Source 11/11/16 2121 Oral     SpO2 11/11/16 2121 100 %     Weight 11/11/16 2122 218 lb 6.4 oz (99.1 kg)     Height 11/11/16 2122 5\' 3"  (1.6 m)     Head Circumference --      Peak Flow --      Pain Score 11/11/16 2134 10     Pain Loc --      Pain Edu? --      Excl. in GC? --     Constitutional: Alert and oriented. Patient appears in  moderate pain, somewhat tearful reporting significant cramping pain in the mid abdomen, pointing towards her umbilicus that radiates up to her left upper abdomen at this time. Eyes: Conjunctivae are normal. PERRL. EOMI. Head: Atraumatic. Nose: No congestion/rhinnorhea. Mouth/Throat: Mucous membranes are moist.  Oropharynx non-erythematous. Neck: No stridor.   Cardiovascular: Normal rate, regular rhythm. Grossly normal heart sounds.  Good peripheral circulation. Respiratory: Normal respiratory effort.  No retractions. Lungs CTAB. Gastrointestinal: Soft and moderately tender, especially in the left upper quadrant and left flank without rebound or guarding. Minimal discomfort in the right lower quadrant, but reports primary pain is in the left upper quadrant. No pain in the right upper quadrant. No distention. No abdominal bruits. No CVA tenderness. Musculoskeletal: No lower extremity tenderness nor edema.  No joint effusions. Neurologic:  Normal speech and language. No gross focal neurologic deficits are appreciated. Skin:  Skin is warm, dry and intact. No rash noted. Psychiatric: Mood and affect are normal. Speech and behavior are normal.  ____________________________________________   LABS (all labs ordered are listed, but only abnormal results are displayed)  Labs Reviewed  COMPREHENSIVE METABOLIC PANEL - Abnormal; Notable for the following:       Result Value   Calcium 8.7 (*)    All other components within normal limits  URINALYSIS, COMPLETE (UACMP) WITH MICROSCOPIC - Abnormal; Notable for the following:    Color, Urine YELLOW (*)    APPearance HAZY (*)    Ketones, ur 5 (*)    Protein, ur 30 (*)    Leukocytes, UA TRACE (*)    Bacteria, UA FEW (*)    Squamous Epithelial / LPF 6-30 (*)    All other components within normal limits  URINE CULTURE  LIPASE, BLOOD  CBC  POCT PREGNANCY, URINE   ____________________________________________  EKG  EKG was performed to evaluate QT  interval before giving multiple doses of antiemetics Heart rate 90 QRS 80 QTc 470 Normal sinus rhythm, no evidence of ectopy EKG time 2315 ____________________________________________  RADIOLOGY  CT abdomen and pelvis pending at time of transfer of care ____________________________________________   PROCEDURES  Procedure(s) performed: None  Procedures  Critical Care performed: No  ____________________________________________   INITIAL IMPRESSION / ASSESSMENT AND PLAN / ED COURSE  Pertinent labs & imaging results that were available during my care of the patient were reviewed by me and considered in my medical decision making (see chart for details).  Differential diagnosis includes but is not limited to, abdominal perforation, aortic dissection, cholecystitis, appendicitis, diverticulitis, colitis, esophagitis/gastritis, kidney stone, pyelonephritis, urinary tract infection, etc.. All are considered in decision and treatment plan. Based upon the patient's presentation and risk factors, I'm concerned about the severity of her pain and we'll proceed with CT scan. She denies any gynecologic symptoms or lower abdominal pain, making an acute ovarian processes seem extremely unlikely. In addition she denies any urinary symptoms,  though her urine sample does appear slightly contaminated I will send it for culture.  No chest pain or shortness of breath. Afebrile. Lab work overall reassuring, but given the severity of reported pain, we'll proceed with CT imaging.  ----------------------------------------- 12:07 AM on 11/12/2016 -----------------------------------------  Ongoing care and disposition assigned to Dr. York Cerise. Plan of care is to follow-up on CT abdomen and pelvis as well as reevaluation after hydration and antiemetics. Patient presenting with mid to left upper quadrant abdominal pain, mild-moderate concern for acute intra-abdominal process given presentation. No GYN symptoms  noted, though would consider TVUS is an adenexal process is seen on CT (though low pre-test suspicion) given clinically GI symptoms. Urine culture sent, appears slightly contaminated sample, the CT scan demonstrates any concern for bladder inflammation or nephritis would likely treat.  If patient has no acute or concerning findings on CT, likely discharge to home with prescription for antiemetics such as Zofran, if she is able to tolerate fluids and feels improved.   Clinical Course as of Nov 12 9  Tue Nov 12, 2016  0010 Assuming care from Dr. Fanny Bien.  In short, Cathy Joseph is a 26 y.o. female with a chief complaint of abdominal pain, N/V/D.  Refer to the original H&P for additional details.  The current plan of care is to follow up CT scan and reassess.  Dr. Fanny Bien anticipates outpatient management if CT generally reassuring.  Questionable UTI, urine culture pending.  [CF]    Clinical Course User Index [CF] Loleta Rose, MD     ____________________________________________   FINAL CLINICAL IMPRESSION(S) / ED DIAGNOSES  Final diagnoses:  Left upper quadrant abdominal pain of unknown etiology      NEW MEDICATIONS STARTED DURING THIS VISIT:  New Prescriptions   No medications on file     Note:  This document was prepared using Dragon voice recognition software and may include unintentional dictation errors.     Sharyn Creamer, MD 11/12/16 [phone]

## 2016-11-11 NOTE — ED Notes (Signed)
Pt vomiting after trying to drink oral contrast. MD informed and orders placed.

## 2016-11-12 MED ORDER — METRONIDAZOLE 500 MG PO TABS
500.0000 mg | ORAL_TABLET | Freq: Once | ORAL | Status: AC
Start: 2016-11-12 — End: 2016-11-12
  Administered 2016-11-12: 500 mg via ORAL
  Filled 2016-11-12: qty 1

## 2016-11-12 MED ORDER — CIPROFLOXACIN HCL 500 MG PO TABS: 500.0000 mg | ORAL_TABLET | Freq: Two times a day (BID) | ORAL | 0 refills | Status: AC

## 2016-11-12 MED ORDER — METRONIDAZOLE 500 MG PO TABS: 500.0000 mg | ORAL_TABLET | Freq: Three times a day (TID) | ORAL | 0 refills | Status: AC

## 2016-11-12 MED ORDER — HYDROCODONE-ACETAMINOPHEN 5-325 MG PO TABS: 1.0000 | ORAL_TABLET | ORAL | 0 refills | Status: DC | PRN

## 2016-11-12 MED ORDER — ONDANSETRON 4 MG PO TBDP: 4.0000 mg | ORAL_TABLET | Freq: Four times a day (QID) | ORAL | 0 refills | Status: DC | PRN

## 2016-11-12 MED ORDER — CIPROFLOXACIN HCL 500 MG PO TABS
500.0000 mg | ORAL_TABLET | ORAL | Status: AC
  Administered 2016-11-12: 500 mg via ORAL
  Filled 2016-11-12: qty 1

## 2016-11-12 NOTE — Discharge Instructions (Addendum)
We believe your symptoms are caused by colitis, likely diverticulitis.  Most of the time this condition (please read through the included information) can be cured with outpatient antibiotics.  Please take the full course of prescribed medication(s) and follow up with the doctors recommended above.  Return to the ED if your abdominal pain worsens or fails to improve, you develop bloody vomiting, bloody diarrhea, you are unable to tolerate fluids due to vomiting, fever greater than 101, or other symptoms that concern you.  Take Norco as prescribed for severe pain. Do not drink alcohol, drive or participate in any other potentially dangerous activities while taking this medication as it may make you sleepy. Do not take this medication with any other sedating medications, either prescription or over-the-counter. If you were prescribed Percocet or Vicodin, do not take these with acetaminophen (Tylenol) as it is already contained within these medications.   This medication is an opiate (or narcotic) pain medication and can be habit forming.  Use it as little as possible to achieve adequate pain control.  Do not use or use it with extreme caution if you have a history of opiate abuse or dependence.  If you are on a pain contract with your primary care doctor or a pain specialist, be sure to let them know you were prescribed this medication today from the [facility].  This medication is intended for your use only - do not give any to anyone else and keep it in a secure place where nobody else, especially children, have access to it.  It will also cause or worsen constipation, so you may want to consider taking an over-the-counter stool softener while you are taking this medication.

## 2016-11-12 NOTE — ED Notes (Signed)
Patient transported to CT 

## 2016-11-12 NOTE — ED Notes (Signed)
Report to Kasey, RN  

## 2016-11-15 LAB — URINE CULTURE
Culture: >=100000 — AB
SPECIAL REQUESTS: NORMAL

## 2017-01-13 ENCOUNTER — Emergency Department
Admission: EM | Admit: 2017-01-13 | Discharge: 2017-01-13 | Disposition: A | Discharge status: Home / Self Care | Payer: Self-pay | Attending: Emergency Medicine | Admitting: Emergency Medicine

## 2017-01-13 ENCOUNTER — Emergency Department: Payer: Self-pay

## 2017-01-13 ENCOUNTER — Encounter: Payer: Self-pay | Admitting: Emergency Medicine

## 2017-01-13 DIAGNOSIS — M25562 Pain in left knee: Secondary | ICD-10-CM | POA: Insufficient documentation

## 2017-01-13 DIAGNOSIS — Z79899 Other long term (current) drug therapy: Secondary | ICD-10-CM | POA: Insufficient documentation

## 2017-01-13 MED ORDER — MELOXICAM 15 MG PO TABS: 15.0000 mg | ORAL_TABLET | Freq: Every day | ORAL | 0 refills | Status: DC

## 2017-01-13 MED ORDER — KETOROLAC TROMETHAMINE 60 MG/2ML IM SOLN
60.0000 mg | Freq: Once | INTRAMUSCULAR | Status: AC
  Administered 2017-01-13: 60 mg via INTRAMUSCULAR
  Filled 2017-01-13: qty 2

## 2017-01-13 NOTE — ED Notes (Signed)
See triage note   Left knee pain for about 2 weeks w/o injury  Pain is lateral   Min swelling  Increased pain with ambulation

## 2017-01-13 NOTE — ED Triage Notes (Signed)
Patient presents to the ED with left knee pain x 2 weeks.  Patient denies any known trauma.  Patient states, "It feels like someone is constantly stabbing me in the knee."  Patient reports swelling as well as pain.

## 2017-01-13 NOTE — ED Provider Notes (Signed)
[facility] Emergency Department Provider Note  ____________________________________________  Time seen: Approximately 6:54 PM  I have reviewed the triage vital signs and the nursing notes.   HISTORY  Chief Complaint Knee Pain    HPI Cathy Joseph is a 26 y.o. female who presents emergency department complaining of continuing knee pain. Patient reports that the pain has been ongoing for approximately 6 months. Patient has been seen in this department prior for same complaint. Patient did not follow-up with orthopedics. She reports that it is constant, anterior knee pain. She reports that it is a burning/stabbing/aching/throbbing sensation. She denies any trauma to the area. She denies any numbness or tingling in her left lower extremity. Patient has been placed on anti-inflammatories in the past but states that she has not taken anything prior to arrival.   Past Medical History:  Diagnosis Date  . Depression     There are no active problems to display for this patient.   History reviewed. No pertinent surgical history.  Prior to Admission medications   Medication Sig Start Date End Date Taking? Authorizing Provider  meloxicam (MOBIC) 15 MG tablet Take 1 tablet (15 mg total) by mouth daily. 01/13/17   Delorise Royals Cuthriell, PA-C  omeprazole (PRILOSEC) 40 MG capsule Take 1 capsule (40 mg total) by mouth daily. 10/29/15 10/28/16  Myrna Blazer, MD  ondansetron (ZOFRAN ODT) 4 MG disintegrating tablet Take 1 tablet (4 mg total) by mouth every 6 (six) hours as needed for nausea or vomiting. 11/12/16   Sharyn Creamer, MD    Allergies Patient has no known allergies.  No family history on file.  Social History Social History  Substance Use Topics  . Smoking status: Never Smoker  . Smokeless tobacco: Never Used  . Alcohol use No     Review of Systems  Constitutional: No fever/chills Cardiovascular: no chest pain. Respiratory: no cough. No  SOB. Musculoskeletal: Positive for left knee pain Skin: Negative for rash, abrasions, lacerations, ecchymosis. Neurological: Negative for headaches, focal weakness or numbness. 10-point ROS otherwise negative.  ____________________________________________   PHYSICAL EXAM:  VITAL SIGNS: ED Triage Vitals  Enc Vitals Group     BP 01/13/17 1721 100/63     Pulse Rate 01/13/17 1721 (!) 51     Resp 01/13/17 1721 18     Temp 01/13/17 1721 98 F (36.7 C)     Temp Source 01/13/17 1721 Oral     SpO2 01/13/17 1721 96 %     Weight 01/13/17 1721 220 lb (99.8 kg)     Height 01/13/17 1721  (1.6 m)     Head Circumference --      Peak Flow --      Pain Score 01/13/17 1720 10     Pain Loc --      Pain Edu? --      Excl. in GC? --      Constitutional: Alert and oriented. Well appearing and in no acute distress. Eyes: Conjunctivae are normal. PERRL. EOMI. Head: Atraumatic. Neck: No stridor.    Cardiovascular: Normal rate, regular rhythm. Normal S1 and S2.  Good peripheral circulation. Respiratory: Normal respiratory effort without tachypnea or retractions. Lungs CTAB. Good air entry to the bases with no decreased or absent breath sounds. Musculoskeletal: Full range of motion to all extremities. No gross deformities appreciated. No edema or gross deformity noted to left knee but inspection. Full range of motion with coaxing. Patient is tender to palpation diffusely over the anterior aspect  of the knee. No point tenderness. No palpable abnormality. There is, valgus, Lachman's, McMurray's is negative. Dorsalis pedis pulse intact distally. Sensation intact Neurologic:  Normal speech and language. No gross focal neurologic deficits are appreciated.  Skin:  Skin is warm, dry and intact. No rash noted. Psychiatric: Mood and affect are normal. Speech and behavior are normal. Patient exhibits appropriate insight and judgement.   ____________________________________________   LABS (all labs  ordered are listed, but only abnormal results are displayed)  Labs Reviewed - No data to display ____________________________________________  EKG   ____________________________________________  RADIOLOGY Festus Barren Cuthriell, personally viewed and evaluated these images (plain radiographs) as part of my medical decision making, as well as reviewing the written report by the radiologist.  Dg Knee Complete 4 Views Left  Result Date: 01/13/2017 CLINICAL DATA:  Diffuse knee pain for 3 months, no known injury, initial encounter EXAM: LEFT KNEE - COMPLETE 4+ VIEW COMPARISON:  07/24/2016 FINDINGS: No evidence of fracture, dislocation, or joint effusion. No evidence of arthropathy or other focal bone abnormality. Soft tissues are unremarkable. IMPRESSION: No acute abnormality noted. Electronically Signed   By: Alcide Clever M.D.   On: 01/13/2017 18:16    ____________________________________________    PROCEDURES  Procedure(s) performed:    Procedures    Medications  ketorolac (TORADOL) injection 60 mg (not administered)     ____________________________________________   INITIAL IMPRESSION / ASSESSMENT AND PLAN / ED COURSE  Pertinent labs & imaging results that were available during my care of the patient were reviewed by me and considered in my medical decision making (see chart for details).  Review of the [place] CSRS was performed in accordance of the [facility] prior to dispensing any controlled drugs.     Patient's diagnosis is consistent with left knee pain. No acute trauma. No acute findings on x-ray. Exam is reassuring. Patient is advised to follow up with orthopedics at this point for chronic knee pain. She was given Toradol injection emergency department.. Patient will be discharged home with prescriptions for meloxicam for symptom control. Patient is to follow up with orthopedics as needed or otherwise directed. Patient is given ED precautions to return to the ED for any  worsening or new symptoms.     ____________________________________________  FINAL CLINICAL IMPRESSION(S) / ED DIAGNOSES  Final diagnoses:  Acute pain of left knee      NEW MEDICATIONS STARTED DURING THIS VISIT:  New Prescriptions   MELOXICAM (MOBIC) 15 MG TABLET    Take 1 tablet (15 mg total) by mouth daily.        This chart was dictated using voice recognition software/Dragon. Despite best efforts to proofread, errors can occur which can change the meaning. Any change was purely unintentional.    Racheal Patches, PA-C 01/13/17 1908    Minna Antis, MD 01/13/17 [phone]

## 2018-01-17 ENCOUNTER — Other Ambulatory Visit: Payer: Self-pay

## 2018-01-17 ENCOUNTER — Emergency Department
Admission: EM | Admit: 2018-01-17 | Discharge: 2018-01-17 | Disposition: A | Discharge status: Home / Self Care | Payer: Self-pay | Attending: Emergency Medicine | Admitting: Emergency Medicine

## 2018-01-17 DIAGNOSIS — Z79899 Other long term (current) drug therapy: Secondary | ICD-10-CM | POA: Insufficient documentation

## 2018-01-17 DIAGNOSIS — K029 Dental caries, unspecified: Secondary | ICD-10-CM | POA: Insufficient documentation

## 2018-01-17 DIAGNOSIS — K0889 Other specified disorders of teeth and supporting structures: Secondary | ICD-10-CM

## 2018-01-17 MED ORDER — OXYCODONE-ACETAMINOPHEN 5-325 MG PO TABS: 1.0000 | ORAL_TABLET | Freq: Once | ORAL | Status: DC

## 2018-01-17 MED ORDER — IBUPROFEN 600 MG PO TABS
600.0000 mg | ORAL_TABLET | Freq: Once | ORAL | Status: AC
  Administered 2018-01-17: 600 mg via ORAL
  Filled 2018-01-17: qty 1

## 2018-01-17 MED ORDER — PENICILLIN V POTASSIUM 500 MG PO TABS: 500.0000 mg | ORAL_TABLET | Freq: Four times a day (QID) | ORAL | 0 refills | Status: DC

## 2018-01-17 MED ORDER — OXYCODONE-ACETAMINOPHEN 5-325 MG PO TABS: 1.0000 | ORAL_TABLET | ORAL | 0 refills | Status: DC | PRN

## 2018-01-17 MED ORDER — PENICILLIN V POTASSIUM 250 MG PO TABS
500.0000 mg | ORAL_TABLET | Freq: Once | ORAL | Status: AC
Start: 2018-01-17 — End: 2018-01-17
  Administered 2018-01-17: 500 mg via ORAL
  Filled 2018-01-17: qty 2

## 2018-01-17 NOTE — ED Notes (Signed)
Pt states she does not have a driver home and is walking home. Pt states she does not have money for a cab at this time. Spoke with md regarding no driver and pt walking, order for discontinue of percocet given and po ibuprofen given.

## 2018-01-17 NOTE — ED Provider Notes (Signed)
[facility] Emergency Department Provider Note  ___________________________________________   First MD Initiated Contact with Patient 01/17/18 0141     (approximate)  I have reviewed the triage vital signs and the nursing notes.   HISTORY  Chief Complaint Facial Pain   HPI Cathy Joseph is a 27 y.o. female with a history of depression as well as panic attacks who is presenting with left-sided jaw pain that is been ongoing for 2 months but worsening.  She says the pain radiates to her left ear as well as throat.  Says the pain is a 10 out of 10 at this time.  Patient does not of a dentist.  Does not smoke.  Does not complaint of inability to swallow or breathe.  Patient also says that she feels slightly panic/anxious but says this is a chronic condition and that this is consistent with her chronic anxiety and panic.   Past Medical History:  Diagnosis Date  . Depression     There are no active problems to display for this patient.   No past surgical history on file.  Prior to Admission medications   Medication Sig Start Date End Date Taking? Authorizing Provider  meloxicam (MOBIC) 15 MG tablet Take 1 tablet (15 mg total) by mouth daily. 01/13/17   Cuthriell, Delorise RoyalsJonathan D, PA-C  omeprazole (PRILOSEC) 40 MG capsule Take 1 capsule (40 mg total) by mouth daily. 10/29/15 10/28/16  Myrna BlazerSchaevitz, David Matthew, MD  ondansetron (ZOFRAN ODT) 4 MG disintegrating tablet Take 1 tablet (4 mg total) by mouth every 6 (six) hours as needed for nausea or vomiting. 11/12/16   Sharyn CreamerQuale, Mark, MD    Allergies Patient has no known allergies.  No family history on file.  Social History Social History   Tobacco Use  . Smoking status: Never Smoker  . Smokeless tobacco: Never Used  Substance Use Topics  . Alcohol use: No  . Drug use: No    Review of Systems  Constitutional: No fever/chills Eyes: No visual changes. ENT: As above Cardiovascular: Denies chest  pain. Respiratory: Denies shortness of breath. Gastrointestinal: No abdominal pain.  No nausea, no vomiting.  No diarrhea.  No constipation. Genitourinary: Negative for dysuria. Musculoskeletal: Negative for back pain. Skin: Negative for rash. Neurological: Negative for headaches, focal weakness or numbness.   ____________________________________________   PHYSICAL EXAM:  VITAL SIGNS: ED Triage Vitals  Enc Vitals Group     BP 01/17/18 0049 117/71     Pulse Rate 01/17/18 0049 79     Resp --      Temp 01/17/18 0049 98.2 F (36.8 C)     Temp Source 01/17/18 0049 Oral     SpO2 01/17/18 0049 98 %     Weight 01/17/18 0056 250 lb (113.4 kg)     Height 01/17/18 0056 5\' 3"  (1.6 m)     Head Circumference --      Peak Flow --      Pain Score 01/17/18 0055 10     Pain Loc --      Pain Edu? --      Excl. in GC? --     Constitutional: Alert and oriented. Well appearing and in no acute distress. Eyes: Conjunctivae are normal.  Head: Atraumatic. Nose: No congestion/rhinnorhea. Mouth/Throat: Mucous membranes are moist.  No tonsillar or uvular swelling or erythema. Left, mandibular molars with erosion especially to the 2 posterior most molars where there is severe erosion down to the pulp.  There is no  swelling.  However, there is tenderness to the jawline, laterally on the left.  No trismus.  Patient controlling her secretions and speaking with a normal voice.  Tenderness to palpation as well to the submandibular tissues but there is no erythema, induration, fluctuance.  Also the left and right TMs are normal.  Neck: No stridor.   Cardiovascular: Normal rate, regular rhythm. Grossly normal heart sounds.  Respiratory: Normal respiratory effort.  No retractions. Lungs CTAB. Gastrointestinal: Soft and nontender. No distention.  Musculoskeletal: No lower extremity tenderness nor edema.  No joint effusions. Neurologic:  Normal speech and language. No gross focal neurologic deficits are  appreciated. Skin:  Skin is warm, dry and intact. No rash noted. Psychiatric: Mood and affect are normal. Speech and behavior are normal.  ____________________________________________   LABS (all labs ordered are listed, but only abnormal results are displayed)  Labs Reviewed - No data to display ____________________________________________  EKG   ____________________________________________  RADIOLOGY   ____________________________________________   PROCEDURES  Procedure(s) performed:   Procedures  Critical Care performed:   ____________________________________________   INITIAL IMPRESSION / ASSESSMENT AND PLAN / ED COURSE  Pertinent labs & imaging results that were available during my care of the patient were reviewed by me and considered in my medical decision making (see chart for details).  DDX: Dental caries, dental abscess, Ludwig's angina, pharyngitis, deep space abscess As part of my medical decision making, I reviewed the following data within the electronic MEDICAL RECORD NUMBER Notes from prior ED visits and [place] Controlled Substance Database  Patient with tenderness to the submandibular tissues.  However, this is likely radiating pain.  There does not appear to be any swelling or induration or fluctuance.  The patient is not diabetic.  No trismus.  Unlikely to be deep space infection as the patient ranges her head neck freely.  No fever.  No drooling and a normal voice.  Patient will be discharged with penicillin, several days of Percocet.  Patient had her [place] controlled substance registry checked and there are no controlled substances that were prescribed listed on the registry.  Patient will also be given dental follow-up.  She is understanding of the treatment plan willing to comply. ____________________________________________   FINAL CLINICAL IMPRESSION(S) / ED DIAGNOSES  Dental pain.  Dental caries.    NEW MEDICATIONS STARTED DURING THIS  VISIT:  New Prescriptions   No medications on file     Note:  This document was prepared using Dragon voice recognition software and may include unintentional dictation errors.     Myrna Blazer, MD 01/17/18 [phone]

## 2018-01-17 NOTE — ED Triage Notes (Signed)
Patient reports having pain from left lower jaw/throat up to left ear for approximately 1 week.  Patient also reports feels like she is about to have a panic attack.

## 2018-06-11 ENCOUNTER — Encounter: Payer: Self-pay | Admitting: Emergency Medicine

## 2018-06-11 ENCOUNTER — Other Ambulatory Visit: Payer: Self-pay

## 2018-06-11 ENCOUNTER — Emergency Department
Admission: EM | Admit: 2018-06-11 | Discharge: 2018-06-11 | Disposition: A | Discharge status: Home / Self Care | Payer: Self-pay | Attending: Emergency Medicine | Admitting: Emergency Medicine

## 2018-06-11 DIAGNOSIS — F329 Major depressive disorder, single episode, unspecified: Secondary | ICD-10-CM | POA: Insufficient documentation

## 2018-06-11 DIAGNOSIS — K0889 Other specified disorders of teeth and supporting structures: Secondary | ICD-10-CM | POA: Insufficient documentation

## 2018-06-11 MED ORDER — PENICILLIN V POTASSIUM 500 MG PO TABS: 500.0000 mg | ORAL_TABLET | Freq: Four times a day (QID) | ORAL | 0 refills | Status: DC

## 2018-06-11 MED ORDER — IBUPROFEN 600 MG PO TABS: 600.0000 mg | ORAL_TABLET | Freq: Three times a day (TID) | ORAL | 0 refills | Status: DC | PRN

## 2018-06-11 NOTE — ED Triage Notes (Signed)
Broke a tooth off yesterday.  Pain and swelling to left upper jaw

## 2018-06-11 NOTE — ED Provider Notes (Signed)
[facility] Emergency Department Provider Note  ____________________________________________   First MD Initiated Contact with Patient 06/11/18 [phone]     (approximate)  I have reviewed the triage vital signs and the nursing notes.   HISTORY  Chief Complaint Oral Swelling  HPI Cathy Joseph is a 27 y.o. female presents to the ED with complaint of dental abscess.  Patient states that she broke her tooth off and since that time has had swelling to her left lower jaw.  She denies any fever or chills.  Currently she does not have a dentist.  She rates her pain as a 10/10.   Past Medical History:  Diagnosis Date  . Depression     There are no active problems to display for this patient.   History reviewed. No pertinent surgical history.  Prior to Admission medications   Medication Sig Start Date End Date Taking? Authorizing Provider  ibuprofen (ADVIL,MOTRIN) 600 MG tablet Take 1 tablet (600 mg total) by mouth every 8 (eight) hours as needed. 06/11/18   Tommi Rumps, PA-C  penicillin v potassium (VEETID) 500 MG tablet Take 1 tablet (500 mg total) by mouth 4 (four) times daily. 06/11/18   Tommi Rumps, PA-C    Allergies Patient has no known allergies.  No family history on file.  Social History Social History   Tobacco Use  . Smoking status: Never Smoker  . Smokeless tobacco: Never Used  Substance Use Topics  . Alcohol use: No  . Drug use: No    Review of Systems Constitutional: No fever/chills Eyes: No visual changes. ENT: No sore throat.  Positive dental pain. Cardiovascular: Denies chest pain. Respiratory: Denies shortness of breath. Skin: Negative for rash. Neurological: Negative for headaches, focal weakness or numbness. ____________________________________________   PHYSICAL EXAM:  VITAL SIGNS: ED Triage Vitals  Enc Vitals Group     BP 06/11/18 0812 103/71     Pulse Rate 06/11/18 0808 67     Resp 06/11/18 0808 14   Temp 06/11/18 0808 98.1 F (36.7 C)     Temp Source 06/11/18 0808 Oral     SpO2 06/11/18 0808 99 %     Weight 06/11/18 0809 200 lb (90.7 kg)     Height 06/11/18 0809 5\' 3"  (1.6 m)     Head Circumference --      Peak Flow --      Pain Score 06/11/18 0809 10     Pain Loc --      Pain Edu? --      Excl. in GC? --    Constitutional: Alert and oriented. Well appearing and in no acute distress. Eyes: Conjunctivae are normal.  Head: Atraumatic. Nose: No congestion/rhinnorhea. Mouth/Throat: Mucous membranes are moist.  Oropharynx non-erythematous.  Left lower molar posteriorly is in poor repair.  Gum is tender surrounding the area.  No obvious abscess or drainage is noted. Neck: No stridor.   Hematological/Lymphatic/Immunilogical: No cervical lymphadenopathy. Cardiovascular: Normal rate, regular rhythm. Grossly normal heart sounds.  Good peripheral circulation. Respiratory: Normal respiratory effort.  No retractions. Lungs CTAB. Musculoskeletal: Moves upper and lower extremities then difficulty.  Normal gait was noted. Neurologic:  Normal speech and language. No gross focal neurologic deficits are appreciated.  Skin:  Skin is warm, dry and intact. No rash noted. Psychiatric: Mood and affect are normal. Speech and behavior are normal.  ____________________________________________   LABS (all labs ordered are listed, but only abnormal results are displayed)  Labs Reviewed - No data  to display  PROCEDURES  Procedure(s) performed: None  Procedures  Critical Care performed: No  ____________________________________________   INITIAL IMPRESSION / ASSESSMENT AND PLAN / ED COURSE  As part of my medical decision making, I reviewed the following data within the electronic MEDICAL RECORD NUMBER Notes from prior ED visits and [place] Controlled Substance Database  Patient presents to the emergency department with complaint of dental pain after breaking her tooth yesterday.  Patient was started on  Pen-Vee K 500 mg 4 times daily and ibuprofen.  She was given list of dental clinics including the walk-in clinic at [facility]  She is encouraged to follow-up with 1 of these dentist.  ____________________________________________   FINAL CLINICAL IMPRESSION(S) / ED DIAGNOSES  Final diagnoses:  Pain, dental     ED Discharge Orders         Ordered    penicillin v potassium (VEETID) 500 MG tablet  4 times daily,   Status:  Discontinued     06/11/18 0842    ibuprofen (ADVIL,MOTRIN) 600 MG tablet  Every 8 hours PRN,   Status:  Discontinued     06/11/18 0842    ibuprofen (ADVIL,MOTRIN) 600 MG tablet  Every 8 hours PRN     06/11/18 0843    penicillin v potassium (VEETID) 500 MG tablet  4 times daily     06/11/18 0843           Note:  This document was prepared using Dragon voice recognition software and may include unintentional dictation errors.    Tommi Rumps, PA-C 06/11/18 1123    Governor Rooks, MD 06/13/18 [phone]

## 2018-06-11 NOTE — Discharge Instructions (Signed)
Begin taking antibiotics as directed and ibuprofen as needed for pain.  A list of dental clinics were provided for you.  Also the dental clinic at [facility] takes walk-ins and that information is provided on a separate piece of paper.  OPTIONS FOR DENTAL FOLLOW UP CARE  [place] Department of Health and Human Services - Local Safety Net Dental Clinics [email or web address]   [facility] [phone])  [facility] [phone])  [place] [phone])  [facility] [phone])  [facility] [phone]) This clinic caters to the indigent population and is on a lottery system. Location: [employer] [facility], [employer], [address] [place] Clinic Hours: Wednesdays from 6pm - 9pm, patients seen by a lottery system. For dates, call or go to [email or web address] Services: Cleanings, fillings and simple extractions. Payment Options: DENTAL WORK IS FREE OF CHARGE. Bring proof of income or support. Best way to get seen: Arrive at 5:15 pm - this is a lottery, NOT first come/first serve, so arriving earlier will not increase your chances of being seen.     [facility] [phone] Select option 1 for emergencies   Location: [facility], [place], [address] [place] Clinic Hours: No walk-ins accepted - call the day before to schedule an appointment. Check in times are 9:30 am and 1:30 pm. Services: Simple extractions, temporary fillings, pulpectomy/pulp debridement, uncomplicated abscess drainage. Payment Options: PAYMENT IS DUE AT THE TIME OF SERVICE.  Fee is usually $100-200, additional surgical procedures (e.g. abscess drainage) may be extra. Cash, checks, Visa/MasterCard accepted.  Can file Medicaid if patient is covered for dental - patient should call case worker to  check. No discount for [facility] patients. Best way to get seen: MUST call the day before and get onto the schedule. Can usually be seen the next 1-2 days. No walk-ins accepted.     [facility] [phone]   Location: [facility], [address] [place] Clinic Hours: M, W, Th, F 8am or 1:30pm, Tues 9a or 1:30 - first come/first served. Services: Simple extractions, temporary fillings, uncomplicated abscess drainage.  You do not need to be an [facility] resident. Payment Options: PAYMENT IS DUE AT THE TIME OF SERVICE. Dental insurance, otherwise sliding scale - bring proof of income or support. Depending on income and treatment needed, cost is usually $50-200. Best way to get seen: Arrive early as it is first come/first served.     [facility] [phone]   Location: [address] Clinic Hours: Mon-Thu 8a-5p Services: Most basic dental services including extractions and fillings. Payment Options: PAYMENT IS DUE AT THE TIME OF SERVICE. Sliding scale, up to 50% off - bring proof if income or support. Medicaid with dental option accepted. Best way to get seen: Call to schedule an appointment, can usually be seen within 2 weeks OR they will try to see walk-ins - show up at 8a or 2p (you may have to wait).     [facility] [phone] [place] RESIDENTS ONLY   Location: [facility], [address] [place], [place] [address] Clinic Hours: By appointment only. Monday - Thursday 8am-5pm, Friday 8am-12pm Services: Cleanings, fillings, extractions. Payment Options: PAYMENT IS DUE AT THE TIME OF SERVICE. Cash, Visa or MasterCard. Sliding scale - $30 minimum per service. Best way to get seen: Come in to office, complete packet and make an  appointment - need proof of income or support monies for each household member and proof of [facility]  residence. Usually takes about a month to get in.     [facility] [phone]   Location: [address] [facility] Clinic Hours: Walk-in Urgent Care Dental Services are offered Monday-Friday mornings only. The numbers of emergencies accepted daily is limited to the number of providers available. Maximum 15 - Mondays, Wednesdays & Thursdays Maximum 10 - Tuesdays & Fridays Services: You do not need to be a [facility] resident to be seen for a dental emergency. Emergencies are defined as pain, swelling, abnormal bleeding, or dental trauma. Walkins will receive x-rays if needed. NOTE: Dental cleaning is not an emergency. Payment Options: PAYMENT IS DUE AT THE TIME OF SERVICE. Minimum co-pay is $40.00 for uninsured patients. Minimum co-pay is $3.00 for Medicaid with dental coverage. Dental Insurance is accepted and must be presented at time of visit. Medicare does not cover dental. Forms of payment: Cash, credit card, checks. Best way to get seen: If not previously registered with the clinic, walk-in dental registration begins at 7:15 am and is on a first come/first serve basis. If previously registered with the clinic, call to make an appointment.     [facility] [phone] [place] RESIDENTS ONLY   Location: [address], [place], [place] Clinic Hours: Mon-Thu 10a-2p Services: Extractions only! Payment Options: FREE (donations accepted) - bring proof of income or support Best way to get seen: Call and schedule an appointment OR come at 8am on the 1st Monday of every month (except for holidays) when it is first come/first served.     [facility] [phone]   Location: [address] [place], [place] Clinic Hours: Friday mornings Services, Payment Options, Best way to get seen: Call for info

## 2018-06-11 NOTE — ED Notes (Signed)
See triage note  States she thinks she has a possible dental abscess    States she has a broken tooth to left lower area

## 2018-06-15 ENCOUNTER — Emergency Department
Admission: EM | Admit: 2018-06-15 | Discharge: 2018-06-15 | Disposition: A | Discharge status: Home / Self Care | Payer: Self-pay | Attending: Emergency Medicine | Admitting: Emergency Medicine

## 2018-06-15 ENCOUNTER — Encounter: Payer: Self-pay | Admitting: Emergency Medicine

## 2018-06-15 ENCOUNTER — Other Ambulatory Visit: Payer: Self-pay

## 2018-06-15 DIAGNOSIS — K529 Noninfective gastroenteritis and colitis, unspecified: Secondary | ICD-10-CM | POA: Insufficient documentation

## 2018-06-15 DIAGNOSIS — R197 Diarrhea, unspecified: Secondary | ICD-10-CM | POA: Insufficient documentation

## 2018-06-15 LAB — CBC
HEMATOCRIT: 34.9 % — AB (ref 35.0–47.0)
Hemoglobin: 12 g/dL (ref 12.0–16.0)
MCH: 29.6 pg (ref 26.0–34.0)
MCHC: 34.2 g/dL (ref 32.0–36.0)
MCV: 86.5 fL (ref 80.0–100.0)
PLATELETS: 199 10*3/uL (ref 150–440)
RBC: 4.04 MIL/uL (ref 3.80–5.20)
RDW: 13.6 % (ref 11.5–14.5)
WBC: 3.7 10*3/uL (ref 3.6–11.0)

## 2018-06-15 LAB — URINALYSIS, ROUTINE W REFLEX MICROSCOPIC
Bilirubin Urine: NEGATIVE
Glucose, UA: NEGATIVE mg/dL
Hgb urine dipstick: NEGATIVE
KETONES UR: NEGATIVE mg/dL
LEUKOCYTES UA: NEGATIVE
NITRITE: NEGATIVE
PROTEIN: NEGATIVE mg/dL
Specific Gravity, Urine: 1.001 — ABNORMAL LOW (ref 1.005–1.030)
pH: 7 (ref 5.0–8.0)

## 2018-06-15 LAB — COMPREHENSIVE METABOLIC PANEL
ALBUMIN: 3.8 g/dL (ref 3.5–5.0)
ALT: 14 U/L (ref 0–44)
ANION GAP: 7 (ref 5–15)
AST: 20 U/L (ref 15–41)
Alkaline Phosphatase: 56 U/L (ref 38–126)
BILIRUBIN TOTAL: 0.7 mg/dL (ref 0.3–1.2)
BUN: 10 mg/dL (ref 6–20)
CHLORIDE: 105 mmol/L (ref 98–111)
CO2: 25 mmol/L (ref 22–32)
Calcium: 8.6 mg/dL — ABNORMAL LOW (ref 8.9–10.3)
Creatinine, Ser: 0.89 mg/dL (ref 0.44–1.00)
GFR calc Af Amer: >60 mL/min (ref >60)
GLUCOSE: 88 mg/dL (ref 70–99)
POTASSIUM: 4.2 mmol/L (ref 3.5–5.1)
Sodium: 137 mmol/L (ref 135–145)
TOTAL PROTEIN: 6.9 g/dL (ref 6.5–8.1)

## 2018-06-15 LAB — POCT PREGNANCY, URINE: Preg Test, Ur: NEGATIVE

## 2018-06-15 LAB — LIPASE, BLOOD: LIPASE: 19 U/L (ref 11–51)

## 2018-06-15 MED ORDER — PROMETHAZINE HCL 25 MG PO TABS: 25.0000 mg | ORAL_TABLET | Freq: Four times a day (QID) | ORAL | 0 refills | Status: DC | PRN

## 2018-06-15 MED ORDER — PROMETHAZINE HCL 25 MG/ML IJ SOLN
25.0000 mg | Freq: Once | INTRAMUSCULAR | Status: AC
  Administered 2018-06-15: 25 mg via INTRAVENOUS
  Filled 2018-06-15: qty 1

## 2018-06-15 MED ORDER — SODIUM CHLORIDE 0.9 % IV BOLUS
1000.0000 mL | Freq: Once | INTRAVENOUS | Status: AC
  Administered 2018-06-15: 1000 mL via INTRAVENOUS

## 2018-06-15 NOTE — ED Triage Notes (Signed)
NVD since last night. NAD. VSS. Alert and oriented. C/o some lower abdominal pain. Reports had tooth pulled last thursday

## 2018-06-15 NOTE — ED Notes (Signed)
A full rainbow sent at this time.

## 2018-06-15 NOTE — ED Notes (Signed)
Pt keeps removing BP cuff as soon as it starts to inflate despite this nurse requesting that she leave it on - it has been replaced several times

## 2018-06-15 NOTE — ED Provider Notes (Signed)
[facility] Emergency Department Provider Note  Time seen: 8:42 AM  I have reviewed the triage vital signs and the nursing notes.   HISTORY  Chief Complaint Emesis    HPI Cathy Joseph is a 27 y.o. female with a past medical history of depression who presents to the emergency department for nausea vomiting diarrhea.  According to the patient since last night she has had nausea and vomiting, now with diarrhea this morning with continued nausea and vomiting.  Denies any fever.  States fairly diffuse abdominal cramping.  Denies any known sick contacts.   Past Medical History:  Diagnosis Date  . Depression     There are no active problems to display for this patient.   History reviewed. No pertinent surgical history.  Prior to Admission medications   Medication Sig Start Date End Date Taking? Authorizing Provider  ibuprofen (ADVIL,MOTRIN) 600 MG tablet Take 1 tablet (600 mg total) by mouth every 8 (eight) hours as needed. 06/11/18   Tommi Rumps, PA-C  penicillin v potassium (VEETID) 500 MG tablet Take 1 tablet (500 mg total) by mouth 4 (four) times daily. 06/11/18   Bridget Hartshorn L, PA-C    No Known Allergies  History reviewed. No pertinent family history.  Social History Social History   Tobacco Use  . Smoking status: Never Smoker  . Smokeless tobacco: Never Used  Substance Use Topics  . Alcohol use: No  . Drug use: No    Review of Systems Constitutional: Negative for fever. ENT: Negative for recent illness/congestion Cardiovascular: Negative for chest pain. Respiratory: Negative for shortness of breath. Gastrointestinal: Diffuse abdominal cramping.  Positive for nausea vomiting and diarrhea. Genitourinary: Negative for urinary compaints Musculoskeletal: Negative for musculoskeletal complaints Skin: Negative for skin complaints  Neurological: Negative for headache All other ROS  negative  ____________________________________________   PHYSICAL EXAM:  VITAL SIGNS: ED Triage Vitals  Enc Vitals Group     BP 06/15/18 0810 104/71     Pulse Rate 06/15/18 0806 65     Resp 06/15/18 0806 20     Temp 06/15/18 0806 98.5 F (36.9 C)     Temp Source 06/15/18 0806 Oral     SpO2 06/15/18 0806 99 %     Weight 06/15/18 0808 198 lb 6.6 oz (90 kg)     Height 06/15/18 0808 5\' 3"  (1.6 m)     Head Circumference --      Peak Flow --      Pain Score 06/15/18 0808 10     Pain Loc --      Pain Edu? --      Excl. in GC? --    Constitutional: Alert and oriented. Well appearing and in no distress. Eyes: Normal exam ENT   Head: Normocephalic and atraumatic.   Mouth/Throat: Mucous membranes are moist. Cardiovascular: Normal rate, regular rhythm. No murmur Respiratory: Normal respiratory effort without tachypnea nor retractions. Breath sounds are clear  Gastrointestinal: Soft, mild diffuse tenderness/cramping per patient.  No focal area of tenderness identified.  No rebound or guarding or distention. Musculoskeletal: Nontender with normal range of motion in all extremities.  Neurologic:  Normal speech and language. No gross focal neurologic deficits  Skin:  Skin is warm, dry and intact.  Psychiatric: Mood and affect are normal.  ____________________________________________  INITIAL IMPRESSION / ASSESSMENT AND PLAN / ED COURSE  Pertinent labs & imaging results that were available during my care of the patient were reviewed by me and considered in  my medical decision making (see chart for details).  Patient presents to the emergency department for nausea vomiting diarrhea since last night.  Differential would include gastroenteritis, enteritis, gastritis, intra-abdominal pathology, dehydration, electrolyte or metabolic abnormality.  We will check labs including urinalysis as well as a pregnancy test.  We will dose IV Phenergan IV fluids and continue to closely monitor.   Patient agreeable to plan of care.  Patient's labs are largely within normal limits including negative pregnancy test.  Patient states she is feeling much better after Phenergan.  We will discharge with a course of Phenergan.  I discussed return precautions for any abdominal pain, worsening symptoms or development of fever.  Patient agreeable to plan of care.  ____________________________________________   FINAL CLINICAL IMPRESSION(S) / ED DIAGNOSES  Gastroenteritis    Minna Antis, MD 06/15/18 1031

## 2018-10-07 NOTE — L&D Delivery Note (Addendum)
Delivery Note At 2:07 AM a non-viable female infant "Luan Moore" was delivered via Vaginal, Spontaneous (Presentation: breech). Weight: 360g, 13 oz.  Anesthesia:  none Est. Blood Loss (mL):  Minimal so far  Mom to stay on L&D.  Baby to [place].  28yo [id number] at 21+6w by a 12 week ultrasound with an EDC of 01/19/20, consistent with an ED scan at [facility] at 6 weeks, who presented in preterm labor. She did have vaginal spotting yesterday, but this evening felt fetal parts vaginally. She was not feeling contractions on admission.  She was found to have [place] of 166 on admission to triage and fetal lower extremities in the vaginal canal. No contractions noted on the monitor. She was moved to a labor room and given buccal misoprostol. The fetal body delivered and the fetal head remained entrapped. With patience and time, the fetal head was delivered for a non-viable infant without a heartbeat. The baby did appear to be approx 22 weeks, formed without defect. No physical abnormalities were noted. Meconium was noted, her eyes were sealed.  The placenta is still in situ at this time. We will get iv access and pain control, and will continue to maneuver it beyond the cervix. She is aware that we may need to proceed to the OR to remove the placenta.  Her baby girl was wrapped and placed in the cooling bassinet. Pastoral care consult was placed.  We have very few prenatal records available for this patient.  Benjaman Kindler 09/14/2019, 2:36 AM

## 2019-04-21 ENCOUNTER — Other Ambulatory Visit: Payer: Self-pay

## 2019-04-21 ENCOUNTER — Emergency Department
Admission: EM | Admit: 2019-04-21 | Discharge: 2019-04-21 | Disposition: A | Discharge status: Home / Self Care | Payer: Self-pay | Attending: Emergency Medicine | Admitting: Emergency Medicine

## 2019-04-21 ENCOUNTER — Encounter: Payer: Self-pay | Admitting: Emergency Medicine

## 2019-04-21 DIAGNOSIS — Z79899 Other long term (current) drug therapy: Secondary | ICD-10-CM | POA: Insufficient documentation

## 2019-04-21 DIAGNOSIS — K0889 Other specified disorders of teeth and supporting structures: Secondary | ICD-10-CM | POA: Insufficient documentation

## 2019-04-21 MED ORDER — MAGIC MOUTHWASH W/LIDOCAINE: 5.0000 mL | Freq: Four times a day (QID) | ORAL | 0 refills | Status: DC

## 2019-04-21 NOTE — Discharge Instructions (Signed)
OPTIONS FOR DENTAL FOLLOW UP CARE ° °[place] Department of Health and Human Services - Local Safety Net Dental Clinics °http://www.ncdhhs.gov/dph/oralhealth/services/safetynetclinics.htm °  °[facility] (336-562-3123) ° °[facility] [place] ([phone]) ° °[facility] ([phone] ext [phone]) ° °[facility] ([phone]) ° °[facility] ([phone]) °This clinic caters to the indigent population and is on a lottery system. °Location: °[facility], [address], [place] °Clinic Hours: °Wednesdays from 6pm - 9pm, patients seen by a lottery system. °For dates, call or go to [email or web address].[facility].edu/[facility]/patients/Dental-[facility] °Services: °Cleanings, fillings and simple extractions. °Payment Options: °DENTAL WORK IS FREE OF CHARGE. Bring proof of income or support. °Best way to get seen: °Arrive at 5:15 pm - this is a lottery, NOT first come/first serve, so arriving earlier will not increase your chances of being seen. °  °  °[facility] °[phone] °Select option 1 for emergencies °  °Location: °[facility], [address], [place] °Clinic Hours: °No walk-ins accepted - call the day before to schedule an appointment. °Check in times are 9:30 am and 1:30 pm. °Services: °Simple extractions, temporary fillings, pulpectomy/pulp debridement, uncomplicated abscess drainage. °Payment Options: °PAYMENT IS DUE AT THE TIME OF SERVICE.  Fee is usually $100-200, additional surgical procedures (e.g. abscess drainage) may be extra. °Cash, checks, Visa/MasterCard accepted.  Can file Medicaid if patient is covered for dental - patient should call case worker to check. °No discount for [facility] patients. °Best way to get seen: °MUST call the day before and get onto the schedule. Can usually be seen the next 1-2 days. No walk-ins accepted. °  °  °[facility] °[phone] °   °Location: °[facility], [address], [place] °Clinic Hours: °M, W, Th, F 8am or 1:30pm, Tues 9a or 1:30 - first come/first served. °Services: °Simple extractions, temporary fillings, uncomplicated abscess drainage.  You do not need to be an [place] resident. °Payment Options: °PAYMENT IS DUE AT THE TIME OF SERVICE. °Dental insurance, otherwise sliding scale - bring proof of income or support. °Depending on income and treatment needed, cost is usually $50-200. °Best way to get seen: °Arrive early as it is first come/first served. °  °  °[facility] °[phone] °  °Location: °[address] °Clinic Hours: °Mon-Thu 8a-5p °Services: °Most basic dental services including extractions and fillings. °Payment Options: °PAYMENT IS DUE AT THE TIME OF SERVICE. °Sliding scale, up to 50% off - bring proof if income or support. °Medicaid with dental option accepted. °Best way to get seen: °Call to schedule an appointment, can usually be seen within 2 weeks OR they will try to see walk-ins - show up at 8a or 2p (you may have to wait). °  °  °[facility] °[phone] °[place] RESIDENTS ONLY °  °Location: °[facility], [address], [place], [place] [address] °Clinic Hours: By appointment only. °Monday - Thursday 8am-5pm, Friday 8am-12pm °Services: Cleanings, fillings, extractions. °Payment Options: °PAYMENT IS DUE AT THE TIME OF SERVICE. °Cash, Visa or MasterCard. Sliding scale - $30 minimum per service. °Best way to get seen: °Come in to office, complete packet and make an appointment - need proof of income °or support monies for each household member and proof of [place] residence. °Usually takes about a month to get in. °  °  °[facility] °[phone] °  °Location: °[address]   [place] °Clinic Hours: Walk-in Urgent Care Dental Services are offered Monday-Friday  mornings only. °The numbers of emergencies accepted daily is limited to the number of °providers available. °Maximum 15 - Mondays, Wednesdays & Thursdays °Maximum 10 - Tuesdays & Fridays °Services: °You do not need to be a [place] resident to be seen for a dental emergency. °Emergencies are defined as pain, swelling, abnormal bleeding, or dental trauma. Walkins will receive x-rays if needed. °NOTE: Dental cleaning is not an emergency. °Payment Options: °PAYMENT IS DUE AT THE TIME OF SERVICE. °Minimum co-pay is $40.00 for uninsured patients. °Minimum co-pay is $3.00 for Medicaid with dental coverage. °Dental Insurance is accepted and must be presented at time of visit. °Medicare does not cover dental. °Forms of payment: Cash, credit card, checks. °Best way to get seen: °If not previously registered with the clinic, walk-in dental registration begins at 7:15 am and is on a first come/first serve basis. °If previously registered with the clinic, call to make an appointment. °  °  °[facility] °[phone] °[place] RESIDENTS ONLY °  °Location: °[address], [place], [place] °Clinic Hours: °Mon-Thu 10a-2p °Services: Extractions only! °Payment Options: °FREE (donations accepted) - bring proof of income or support °Best way to get seen: °Call and schedule an appointment OR come at 8am on the 1st Monday of every month (except for holidays) when it is first come/first served. °  °  °[facility] °[phone] °  °Location: °[address], [place] °Clinic Hours: °Friday mornings °Services, Payment Options, Best way to get seen: °Call for info °

## 2019-04-21 NOTE — ED Triage Notes (Signed)
Pt presents to ED c/o dental pain on both sides x1 wk. Has not seen dentist. No meds taken today.

## 2019-04-21 NOTE — ED Notes (Signed)
Pt to the er for dental pain for a few days. Pt is soft spoken and not moving her moth. Pt says she has a bad tooth on the top of her left top and right top.

## 2019-04-21 NOTE — ED Provider Notes (Signed)
[facility] Emergency Department Provider Note  ____________________________________________  Time seen: Approximately 9:12 PM  I have reviewed the triage vital signs and the nursing notes.   HISTORY  Chief Complaint Dental Pain    HPI Cathy Joseph is a 28 y.o. female who presents the emergency department complaining of bilateral dental pain.  On presentation to the room, patient would not answer questions.  She repeatedly pointing towards her mouth.  I advised the patient that I cannot help her if she did not discussed what was going on.  At that time, patient states that she had been experiencing dental pain x2 days.  Patient advised that she not been taking any medication for same.  No fevers or chills, difficulty breathing or swallowing.  On review of patient's medical records, patient had [facility] and depression.  Patient had been prescribed antibiotics and pain medication.  When I confronted the patient about her recent visit for dental pain, patient advised that she had been taking her antibiotics.  Patient denied any other medicines.  On review of the controlled substance database, patient had filled a prescription for Percocet yesterday.  When I confronted the patient about this, she advised that she took the prescription yesterday.         Past Medical History:  Diagnosis Date  . Depression     There are no active problems to display for this patient.   History reviewed. No pertinent surgical history.  Prior to Admission medications   Medication Sig Start Date End Date Taking? Authorizing Provider  ibuprofen (ADVIL,MOTRIN) 600 MG tablet Take 1 tablet (600 mg total) by mouth every 8 (eight) hours as needed. 06/11/18   Tommi RumpsSummers, Rhonda L, PA-C  magic mouthwash w/lidocaine SOLN Take 5 mLs by mouth 4 (four) times daily. 04/21/19   , Delorise RoyalsJonathan D, PA-C  penicillin v potassium (VEETID) 500 MG tablet Take 1  tablet (500 mg total) by mouth 4 (four) times daily. 06/11/18   Tommi RumpsSummers, Rhonda L, PA-C  promethazine (PHENERGAN) 25 MG tablet Take 1 tablet (25 mg total) by mouth every 6 (six) hours as needed for nausea or vomiting. 06/15/18   Minna AntisPaduchowski, Kevin, MD    Allergies Patient has no known allergies.  History reviewed. No pertinent family history.  Social History Social History   Tobacco Use  . Smoking status: Never Smoker  . Smokeless tobacco: Never Used  Substance Use Topics  . Alcohol use: No  . Drug use: No     Review of Systems  Constitutional: No fever/chills Eyes: No visual changes. No discharge ENT: Positive for bilateral dental pain, patient does not clarify upper or lower dental pain. Cardiovascular: no chest pain. Respiratory: no cough. No SOB. Gastrointestinal: No abdominal pain.  No nausea, no vomiting.  No diarrhea.  No constipation. Musculoskeletal: Negative for musculoskeletal pain. Skin: Negative for rash, abrasions, lacerations, ecchymosis. Neurological: Negative for headaches, focal weakness or numbness. 10-point ROS otherwise negative.  ____________________________________________   PHYSICAL EXAM:  VITAL SIGNS: ED Triage Vitals  Enc Vitals Group     BP 04/21/19 1910 109/70     Pulse Rate 04/21/19 1910 75     Resp 04/21/19 1910 17     Temp 04/21/19 1910 98.6 F (37 C)     Temp Source 04/21/19 1910 Axillary     SpO2 04/21/19 1910 99 %     Weight 04/21/19 1912 198 lb (89.8 kg)     Height 04/21/19  1912 5\' 3"  (1.6 m)     Head Circumference --      Peak Flow --      Pain Score 04/21/19 1911 10     Pain Loc --      Pain Edu? --      Excl. in GC? --      Constitutional: Alert and oriented. Well appearing and in no acute distress. Eyes: Conjunctivae are normal. PERRL. EOMI. Head: Atraumatic. ENT:      Ears:       Nose: No congestion/rhinnorhea.      Mouth/Throat: Mucous membranes are moist.  Visualization in the mouth reveals no erythema or edema.   Tonsils and uvula are unremarkable.  Patient has scattered caries and missing dentition throughout.  No loose dentition.  No evidence of dental infection.  Subglandular space is soft and nonedematous. Neck: No stridor.  Neck is supple full range of motion Hematological/Lymphatic/Immunilogical: No cervical lymphadenopathy. Cardiovascular: Normal rate, regular rhythm. Normal S1 and S2.  Good peripheral circulation. Respiratory: Normal respiratory effort without tachypnea or retractions. Lungs CTAB. Good air entry to the bases with no decreased or absent breath sounds. Musculoskeletal: Full range of motion to all extremities. No gross deformities appreciated. Neurologic:  Normal speech and language. No gross focal neurologic deficits are appreciated.  Skin:  Skin is warm, dry and intact. No rash noted. Psychiatric: Mood and affect are normal. Speech and behavior are normal. Patient exhibits appropriate insight and judgement.   ____________________________________________   LABS (all labs ordered are listed, but only abnormal results are displayed)  Labs Reviewed - No data to display ____________________________________________  EKG   ____________________________________________  RADIOLOGY   No results found.  ____________________________________________    PROCEDURES  Procedure(s) performed:    Procedures    Medications - No data to display   ____________________________________________   INITIAL IMPRESSION / ASSESSMENT AND PLAN / ED COURSE  Pertinent labs & imaging results that were available during my care of the patient were reviewed by me and considered in my medical decision making (see chart for details).  Review of the [place] CSRS was performed in accordance of the [facility] prior to dispensing any controlled drugs.           Patient's diagnosis is consistent with dental pain.  Patient presented to the emergency department complaining of dental pain.  Initially,  patient would not answer questions and just pointed out her mouth.  After finally engaging the patient where she would answer questions, patient reports 2-day history of increasing bilateral dental pain.  She was unable to clarify whether this was the upper or lower dentition.  On review of medical records, patient had [facility] for same complaint.  Patient been given antibiotics and pain medication.  Patient initially declined use of any medication for this complaint.  When I asked her about antibiotic use she reluctantly admitted that she had been using antibiotics for same.  Patient denied pain medication use but patient had picked up 10 Percocets yesterday.  No signs of infection on exam.  I advised the patient follow-up with dentist at this time.  Magic mouthwash for symptom relief.  As there is no indication of infection and patient is already on antibiotics, no further antibiotic is prescribed.  No controlled substances are prescribed at this time.. Patient is given ED precautions to return to the ED for any worsening or new symptoms.     ____________________________________________  FINAL CLINICAL IMPRESSION(S) / ED  DIAGNOSES  Final diagnoses:  Pain, dental      NEW MEDICATIONS STARTED DURING THIS VISIT:  ED Discharge Orders         Ordered    magic mouthwash w/lidocaine SOLN  4 times daily    Note to Pharmacy: Dispense in a 1/1/1 ratio. Use lidocaine, diphenhydramine, prednisolone   04/21/19 2121              This chart was dictated using voice recognition software/Dragon. Despite best efforts to proofread, errors can occur which can change the meaning. Any change was purely unintentional.    Darletta Moll, PA-C 04/21/19 2138    Nena Polio, MD 04/21/19 2250

## 2019-07-02 ENCOUNTER — Other Ambulatory Visit: Payer: Self-pay | Admitting: Family Medicine

## 2019-07-02 DIAGNOSIS — Z3482 Encounter for supervision of other normal pregnancy, second trimester: Secondary | ICD-10-CM

## 2019-07-09 ENCOUNTER — Other Ambulatory Visit: Payer: Self-pay | Admitting: Family Medicine

## 2019-07-09 DIAGNOSIS — O3680X0 Pregnancy with inconclusive fetal viability, not applicable or unspecified: Secondary | ICD-10-CM

## 2019-07-12 ENCOUNTER — Ambulatory Visit: Payer: Self-pay

## 2019-07-12 ENCOUNTER — Other Ambulatory Visit: Payer: Self-pay

## 2019-07-12 ENCOUNTER — Ambulatory Visit: Admission: RE | Admit: 2019-07-12 | Payer: Medicaid Other | Source: Ambulatory Visit

## 2019-07-12 ENCOUNTER — Ambulatory Visit
Admission: RE | Admit: 2019-07-12 | Discharge: 2019-07-12 | Disposition: A | Discharge status: Home / Self Care | Payer: Medicaid Other | Source: Ambulatory Visit | Attending: Family Medicine | Admitting: Family Medicine

## 2019-07-12 DIAGNOSIS — O3680X0 Pregnancy with inconclusive fetal viability, not applicable or unspecified: Secondary | ICD-10-CM

## 2019-08-04 ENCOUNTER — Other Ambulatory Visit: Payer: Self-pay | Admitting: Family Medicine

## 2019-08-04 DIAGNOSIS — Z3482 Encounter for supervision of other normal pregnancy, second trimester: Secondary | ICD-10-CM

## 2019-08-11 ENCOUNTER — Ambulatory Visit: Payer: Medicaid Other

## 2019-08-31 ENCOUNTER — Other Ambulatory Visit: Payer: Self-pay | Admitting: Family Medicine

## 2019-08-31 DIAGNOSIS — Z3482 Encounter for supervision of other normal pregnancy, second trimester: Secondary | ICD-10-CM

## 2019-09-08 ENCOUNTER — Ambulatory Visit: Payer: Medicaid Other

## 2019-09-09 ENCOUNTER — Ambulatory Visit: Payer: Medicaid Other

## 2019-09-14 ENCOUNTER — Inpatient Hospital Stay: Payer: Medicaid Other | Admitting: Anesthesiology

## 2019-09-14 ENCOUNTER — Encounter
Admission: EM | Disposition: A | Discharge status: Home / Self Care | Payer: Self-pay | Source: Home / Self Care | Attending: Obstetrics and Gynecology

## 2019-09-14 ENCOUNTER — Inpatient Hospital Stay
Admission: EM | Admit: 2019-09-14 | Discharge: 2019-09-16 | DRG: 768 | Disposition: A | Discharge status: Home / Self Care | Payer: Medicaid Other | Attending: Obstetrics & Gynecology | Admitting: Obstetrics & Gynecology

## 2019-09-14 ENCOUNTER — Other Ambulatory Visit: Payer: Self-pay

## 2019-09-14 DIAGNOSIS — O99334 Smoking (tobacco) complicating childbirth: Secondary | ICD-10-CM | POA: Diagnosis present

## 2019-09-14 DIAGNOSIS — O209 Hemorrhage in early pregnancy, unspecified: Secondary | ICD-10-CM | POA: Diagnosis present

## 2019-09-14 DIAGNOSIS — F17210 Nicotine dependence, cigarettes, uncomplicated: Secondary | ICD-10-CM | POA: Diagnosis present

## 2019-09-14 DIAGNOSIS — O321XX0 Maternal care for breech presentation, not applicable or unspecified: Secondary | ICD-10-CM | POA: Diagnosis present

## 2019-09-14 DIAGNOSIS — Z3A21 21 weeks gestation of pregnancy: Secondary | ICD-10-CM

## 2019-09-14 DIAGNOSIS — D62 Acute posthemorrhagic anemia: Secondary | ICD-10-CM | POA: Diagnosis not present

## 2019-09-14 DIAGNOSIS — O9081 Anemia of the puerperium: Secondary | ICD-10-CM | POA: Diagnosis not present

## 2019-09-14 DIAGNOSIS — O99214 Obesity complicating childbirth: Secondary | ICD-10-CM | POA: Diagnosis present

## 2019-09-14 DIAGNOSIS — O3432 Maternal care for cervical incompetence, second trimester: Secondary | ICD-10-CM | POA: Diagnosis present

## 2019-09-14 DIAGNOSIS — Z20828 Contact with and (suspected) exposure to other viral communicable diseases: Secondary | ICD-10-CM | POA: Diagnosis present

## 2019-09-14 HISTORY — PX: DILATION AND CURETTAGE OF UTERUS: SHX78

## 2019-09-14 LAB — CBC
HCT: 26.9 % — ABNORMAL LOW (ref 36.0–46.0)
HCT: 26.9 % — ABNORMAL LOW (ref 36.0–46.0)
Hemoglobin: 8.8 g/dL — ABNORMAL LOW (ref 12.0–15.0)
Hemoglobin: 8.8 g/dL — ABNORMAL LOW (ref 12.0–15.0)
MCH: 28.5 pg (ref 26.0–34.0)
MCH: 29.1 pg (ref 26.0–34.0)
MCHC: 32.7 g/dL (ref 30.0–36.0)
MCHC: 32.7 g/dL (ref 30.0–36.0)
MCV: 87.1 fL (ref 80.0–100.0)
MCV: 89.1 fL (ref 80.0–100.0)
Platelets: 185 10*3/uL (ref 150–400)
Platelets: 181 10*3/uL (ref 150–400)
RBC: 3.09 MIL/uL — ABNORMAL LOW (ref 3.87–5.11)
RBC: 3.02 MIL/uL — ABNORMAL LOW (ref 3.87–5.11)
RDW: 13.1 % (ref 11.5–15.5)
RDW: 13.2 % (ref 11.5–15.5)
WBC: 9.8 10*3/uL (ref 4.0–10.5)
WBC: 16.9 10*3/uL — ABNORMAL HIGH (ref 4.0–10.5)
nRBC: 0 % (ref 0.0–0.2)
nRBC: 0 % (ref 0.0–0.2)

## 2019-09-14 LAB — COMPREHENSIVE METABOLIC PANEL
ALT: 18 U/L (ref 0–44)
AST: 29 U/L (ref 15–41)
Albumin: 2.8 g/dL — ABNORMAL LOW (ref 3.5–5.0)
Alkaline Phosphatase: 52 U/L (ref 38–126)
Anion gap: 10 (ref 5–15)
BUN: 8 mg/dL (ref 6–20)
CO2: 18 mmol/L — ABNORMAL LOW (ref 22–32)
Calcium: 8 mg/dL — ABNORMAL LOW (ref 8.9–10.3)
Chloride: 105 mmol/L (ref 98–111)
Creatinine, Ser: 0.59 mg/dL (ref 0.44–1.00)
GFR calc Af Amer: >60 mL/min (ref >60)
GFR calc non Af Amer: >60 mL/min (ref >60)
Glucose, Bld: 121 mg/dL — ABNORMAL HIGH (ref 70–99)
Potassium: 4.2 mmol/L (ref 3.5–5.1)
Sodium: 133 mmol/L — ABNORMAL LOW (ref 135–145)
Total Bilirubin: 0.4 mg/dL (ref 0.3–1.2)
Total Protein: 6.1 g/dL — ABNORMAL LOW (ref 6.5–8.1)

## 2019-09-14 LAB — URINALYSIS, ROUTINE W REFLEX MICROSCOPIC
Bilirubin Urine: NEGATIVE
Glucose, UA: NEGATIVE mg/dL
Hgb urine dipstick: NEGATIVE
Ketones, ur: NEGATIVE mg/dL
Leukocytes,Ua: NEGATIVE
Nitrite: NEGATIVE
Protein, ur: NEGATIVE mg/dL
Specific Gravity, Urine: 1.011 (ref 1.005–1.030)
pH: 7 (ref 5.0–8.0)

## 2019-09-14 LAB — TYPE AND SCREEN
ABO/RH(D): O POS
Antibody Screen: NEGATIVE

## 2019-09-14 LAB — SARS CORONAVIRUS 2 BY RT PCR (HOSPITAL ORDER, PERFORMED IN ~~LOC~~ HOSPITAL LAB): SARS Coronavirus 2: NEGATIVE

## 2019-09-14 LAB — RPR: RPR Ser Ql: NONREACTIVE

## 2019-09-14 SURGERY — DILATION AND CURETTAGE
Anesthesia: General | Site: Vagina

## 2019-09-14 MED ORDER — MIDAZOLAM HCL 2 MG/2ML IJ SOLN
INTRAMUSCULAR | Status: AC
  Filled 2019-09-14: qty 2

## 2019-09-14 MED ORDER — DIBUCAINE (PERIANAL) 1 % EX OINT: 1.0000 "application " | TOPICAL_OINTMENT | CUTANEOUS | Status: DC | PRN

## 2019-09-14 MED ORDER — ONDANSETRON HCL 4 MG/2ML IJ SOLN
INTRAMUSCULAR | Status: AC
  Filled 2019-09-14: qty 2

## 2019-09-14 MED ORDER — OXYCODONE HCL 5 MG PO TABS
5.0000 mg | ORAL_TABLET | ORAL | Status: DC | PRN
  Administered 2019-09-14 – 2019-09-15 (×3): 5 mg via ORAL
  Filled 2019-09-14 (×3): qty 1

## 2019-09-14 MED ORDER — MEASLES, MUMPS & RUBELLA VAC IJ SOLR
0.5000 mL | Freq: Once | INTRAMUSCULAR | Status: DC
  Filled 2019-09-14: qty 0.5

## 2019-09-14 MED ORDER — MISOPROSTOL 200 MCG PO TABS: 400.0000 ug | ORAL_TABLET | ORAL | Status: DC

## 2019-09-14 MED ORDER — AMMONIA AROMATIC IN INHA
RESPIRATORY_TRACT | Status: AC
  Filled 2019-09-14: qty 10

## 2019-09-14 MED ORDER — SODIUM CHLORIDE 0.9% FLUSH: 3.0000 mL | Freq: Two times a day (BID) | INTRAVENOUS | Status: DC

## 2019-09-14 MED ORDER — ZOLPIDEM TARTRATE 5 MG PO TABS
5.0000 mg | ORAL_TABLET | Freq: Every evening | ORAL | Status: DC | PRN
  Administered 2019-09-14: 5 mg via ORAL
  Filled 2019-09-14: qty 1

## 2019-09-14 MED ORDER — ACETAMINOPHEN 325 MG PO TABS: 650.0000 mg | ORAL_TABLET | ORAL | Status: DC | PRN

## 2019-09-14 MED ORDER — KETOROLAC TROMETHAMINE 30 MG/ML IJ SOLN
30.0000 mg | Freq: Once | INTRAMUSCULAR | Status: AC
  Administered 2019-09-14: 30 mg via INTRAVENOUS

## 2019-09-14 MED ORDER — TRANEXAMIC ACID 1000 MG/10ML IV SOLN
INTRAVENOUS | Status: AC
  Filled 2019-09-14: qty 10

## 2019-09-14 MED ORDER — FENTANYL CITRATE (PF) 100 MCG/2ML IJ SOLN: 25.0000 ug | INTRAMUSCULAR | Status: DC | PRN

## 2019-09-14 MED ORDER — SENNOSIDES-DOCUSATE SODIUM 8.6-50 MG PO TABS
2.0000 | ORAL_TABLET | ORAL | Status: DC
  Administered 2019-09-15: 2 via ORAL
  Filled 2019-09-14: qty 2

## 2019-09-14 MED ORDER — LIDOCAINE HCL (PF) 1 % IJ SOLN
INTRAMUSCULAR | Status: AC
  Filled 2019-09-14: qty 30

## 2019-09-14 MED ORDER — MISOPROSTOL 200 MCG PO TABS
ORAL_TABLET | ORAL | Status: AC
  Administered 2019-09-14: 400 ug via BUCCAL
  Filled 2019-09-14: qty 4

## 2019-09-14 MED ORDER — OXYTOCIN 10 UNIT/ML IJ SOLN
INTRAMUSCULAR | Status: AC
  Administered 2019-09-14: 10 [IU]
  Filled 2019-09-14: qty 2

## 2019-09-14 MED ORDER — LACTATED RINGERS IV SOLN
INTRAVENOUS | Status: DC | PRN
  Administered 2019-09-14: 04:00:00 via INTRAVENOUS

## 2019-09-14 MED ORDER — PROPOFOL 10 MG/ML IV BOLUS
INTRAVENOUS | Status: AC
  Filled 2019-09-14: qty 20

## 2019-09-14 MED ORDER — ONDANSETRON HCL 4 MG/2ML IJ SOLN
INTRAMUSCULAR | Status: DC | PRN
  Administered 2019-09-14: 4 mg via INTRAVENOUS

## 2019-09-14 MED ORDER — BENZOCAINE-MENTHOL 20-0.5 % EX AERO: 1.0000 "application " | INHALATION_SPRAY | CUTANEOUS | Status: DC | PRN

## 2019-09-14 MED ORDER — ONDANSETRON HCL 4 MG/2ML IJ SOLN
4.0000 mg | INTRAMUSCULAR | Status: DC | PRN
  Administered 2019-09-14: 4 mg via INTRAVENOUS
  Filled 2019-09-14: qty 2

## 2019-09-14 MED ORDER — DEXAMETHASONE SODIUM PHOSPHATE 10 MG/ML IJ SOLN
INTRAMUSCULAR | Status: DC | PRN
  Administered 2019-09-14: 10 mg via INTRAVENOUS

## 2019-09-14 MED ORDER — ONDANSETRON HCL 4 MG/2ML IJ SOLN: 4.0000 mg | Freq: Four times a day (QID) | INTRAMUSCULAR | Status: DC | PRN

## 2019-09-14 MED ORDER — PROPOFOL 10 MG/ML IV BOLUS
INTRAVENOUS | Status: DC | PRN
  Administered 2019-09-14: 150 mg via INTRAVENOUS

## 2019-09-14 MED ORDER — SILVER NITRATE-POT NITRATE 75-25 % EX MISC
CUTANEOUS | Status: AC
  Filled 2019-09-14: qty 40

## 2019-09-14 MED ORDER — HYDROMORPHONE HCL 1 MG/ML IJ SOLN
1.0000 mg | INTRAMUSCULAR | Status: DC | PRN
  Administered 2019-09-14 (×3): 2 mg via INTRAVENOUS
  Filled 2019-09-14 (×3): qty 2

## 2019-09-14 MED ORDER — WITCH HAZEL-GLYCERIN EX PADS: 1.0000 "application " | MEDICATED_PAD | CUTANEOUS | Status: DC | PRN

## 2019-09-14 MED ORDER — FENTANYL CITRATE (PF) 100 MCG/2ML IJ SOLN
INTRAMUSCULAR | Status: AC
  Filled 2019-09-14: qty 2

## 2019-09-14 MED ORDER — SODIUM CHLORIDE 0.9 % IV SOLN: 250.0000 mL | INTRAVENOUS | Status: DC | PRN

## 2019-09-14 MED ORDER — LACTATED RINGERS IV SOLN: INTRAVENOUS | Status: DC

## 2019-09-14 MED ORDER — ACETAMINOPHEN 325 MG PO TABS
650.0000 mg | ORAL_TABLET | ORAL | Status: DC | PRN
  Administered 2019-09-15 (×2): 650 mg via ORAL
  Filled 2019-09-14 (×2): qty 2

## 2019-09-14 MED ORDER — SOD CITRATE-CITRIC ACID 500-334 MG/5ML PO SOLN: 30.0000 mL | ORAL | Status: DC | PRN

## 2019-09-14 MED ORDER — KETOROLAC TROMETHAMINE 30 MG/ML IJ SOLN
INTRAMUSCULAR | Status: AC
  Filled 2019-09-14: qty 1

## 2019-09-14 MED ORDER — TETANUS-DIPHTH-ACELL PERTUSSIS 5-2.5-18.5 LF-MCG/0.5 IM SUSP
0.5000 mL | Freq: Once | INTRAMUSCULAR | Status: DC
  Filled 2019-09-14: qty 0.5

## 2019-09-14 MED ORDER — MIDAZOLAM HCL 2 MG/2ML IJ SOLN
INTRAMUSCULAR | Status: DC | PRN
  Administered 2019-09-14: 1 mg via INTRAVENOUS

## 2019-09-14 MED ORDER — LACTATED RINGERS IV SOLN
INTRAVENOUS | Status: DC
  Administered 2019-09-14 (×2): via INTRAVENOUS

## 2019-09-14 MED ORDER — LIDOCAINE HCL (PF) 1 % IJ SOLN: 30.0000 mL | INTRAMUSCULAR | Status: DC | PRN

## 2019-09-14 MED ORDER — FENTANYL CITRATE (PF) 100 MCG/2ML IJ SOLN
INTRAMUSCULAR | Status: DC | PRN
  Administered 2019-09-14 (×2): 25 ug via INTRAVENOUS

## 2019-09-14 MED ORDER — IBUPROFEN 600 MG PO TABS
600.0000 mg | ORAL_TABLET | Freq: Four times a day (QID) | ORAL | Status: DC
  Administered 2019-09-14 – 2019-09-16 (×6): 600 mg via ORAL
  Filled 2019-09-14 (×6): qty 1

## 2019-09-14 MED ORDER — SUCCINYLCHOLINE CHLORIDE 20 MG/ML IJ SOLN
INTRAMUSCULAR | Status: DC | PRN
  Administered 2019-09-14: 100 mg via INTRAVENOUS

## 2019-09-14 MED ORDER — ONDANSETRON HCL 4 MG/2ML IJ SOLN: 4.0000 mg | Freq: Once | INTRAMUSCULAR | Status: DC | PRN

## 2019-09-14 MED ORDER — SIMETHICONE 80 MG PO CHEW: 80.0000 mg | CHEWABLE_TABLET | ORAL | Status: DC | PRN

## 2019-09-14 MED ORDER — LIDOCAINE HCL (PF) 2 % IJ SOLN
INTRAMUSCULAR | Status: AC
  Filled 2019-09-14: qty 10

## 2019-09-14 MED ORDER — COCONUT OIL OIL: 1.0000 "application " | TOPICAL_OIL | Status: DC | PRN

## 2019-09-14 MED ORDER — SUCCINYLCHOLINE CHLORIDE 20 MG/ML IJ SOLN
INTRAMUSCULAR | Status: AC
  Filled 2019-09-14: qty 1

## 2019-09-14 MED ORDER — TRANEXAMIC ACID-NACL 1000-0.7 MG/100ML-% IV SOLN
INTRAVENOUS | Status: DC | PRN
  Administered 2019-09-14: 1000 mg via INTRAVENOUS

## 2019-09-14 MED ORDER — OXYCODONE-ACETAMINOPHEN 5-325 MG PO TABS: 1.0000 | ORAL_TABLET | ORAL | Status: DC | PRN

## 2019-09-14 MED ORDER — EPHEDRINE SULFATE 50 MG/ML IJ SOLN
INTRAMUSCULAR | Status: DC | PRN
  Administered 2019-09-14 (×3): 10 mg via INTRAVENOUS

## 2019-09-14 MED ORDER — BISACODYL 10 MG RE SUPP
10.0000 mg | Freq: Every day | RECTAL | Status: DC | PRN
  Filled 2019-09-14: qty 1

## 2019-09-14 MED ORDER — OXYTOCIN BOLUS FROM INFUSION: 500.0000 mL | Freq: Once | INTRAVENOUS | Status: DC

## 2019-09-14 MED ORDER — LACTATED RINGERS IV SOLN: 500.0000 mL | INTRAVENOUS | Status: DC | PRN

## 2019-09-14 MED ORDER — SODIUM CHLORIDE 0.9% FLUSH: 3.0000 mL | INTRAVENOUS | Status: DC | PRN

## 2019-09-14 MED ORDER — OXYTOCIN 40 UNITS IN NORMAL SALINE INFUSION - SIMPLE MED
INTRAVENOUS | Status: AC
  Administered 2019-09-14: 03:00:00
  Filled 2019-09-14: qty 1000

## 2019-09-14 MED ORDER — LIDOCAINE HCL (CARDIAC) PF 100 MG/5ML IV SOSY
PREFILLED_SYRINGE | INTRAVENOUS | Status: DC | PRN
  Administered 2019-09-14: 100 mg via INTRAVENOUS

## 2019-09-14 MED ORDER — ONDANSETRON HCL 4 MG PO TABS: 4.0000 mg | ORAL_TABLET | ORAL | Status: DC | PRN

## 2019-09-14 MED ORDER — FLEET ENEMA 7-19 GM/118ML RE ENEM: 1.0000 | ENEMA | Freq: Every day | RECTAL | Status: DC | PRN

## 2019-09-14 MED ORDER — IBUPROFEN 600 MG PO TABS: 600.0000 mg | ORAL_TABLET | Freq: Four times a day (QID) | ORAL | Status: DC

## 2019-09-14 MED ORDER — PRENATAL MULTIVITAMIN CH
1.0000 | ORAL_TABLET | Freq: Every day | ORAL | Status: DC
  Administered 2019-09-15: 1 via ORAL
  Filled 2019-09-14: qty 1

## 2019-09-14 MED ORDER — OXYTOCIN 40 UNITS IN NORMAL SALINE INFUSION - SIMPLE MED
2.5000 [IU]/h | INTRAVENOUS | Status: DC
  Administered 2019-09-14: 2.5 [IU]/h via INTRAVENOUS
  Filled 2019-09-14: qty 1000

## 2019-09-14 MED ORDER — CEFAZOLIN SODIUM-DEXTROSE 2-4 GM/100ML-% IV SOLN
2.0000 g | INTRAVENOUS | Status: AC
  Administered 2019-09-14: 2 g via INTRAVENOUS
  Filled 2019-09-14: qty 100

## 2019-09-14 MED ORDER — PHENYLEPHRINE HCL (PRESSORS) 10 MG/ML IV SOLN
INTRAVENOUS | Status: DC | PRN
  Administered 2019-09-14: 200 ug via INTRAVENOUS
  Administered 2019-09-14 (×2): 100 ug via INTRAVENOUS

## 2019-09-14 MED ORDER — DIPHENHYDRAMINE HCL 25 MG PO CAPS: 25.0000 mg | ORAL_CAPSULE | Freq: Four times a day (QID) | ORAL | Status: DC | PRN

## 2019-09-14 MED ORDER — OXYCODONE-ACETAMINOPHEN 5-325 MG PO TABS: 2.0000 | ORAL_TABLET | ORAL | Status: DC | PRN

## 2019-09-14 SURGICAL SUPPLY — 15 items
BAG URINE DRAIN 2000ML AR STRL (UROLOGICAL SUPPLIES) ×6 IMPLANT
BALLN POSTPARTUM SOS BAKRI (BALLOONS) ×3
BALLOON POSTPARTUM SOS BAKRI (BALLOONS) ×1 IMPLANT
BOWL CEMENT MIXING ADV NOZZLE (MISCELLANEOUS) ×3 IMPLANT
CATH FOLEY 2W COUNCIL 5CC 16FR (CATHETERS) ×3 IMPLANT
CATH ROBINSON RED A/P 16FR (CATHETERS) ×3 IMPLANT
COVER WAND RF STERILE (DRAPES) ×3 IMPLANT
GLOVE BIO SURGEON STRL SZ7 (GLOVE) ×3 IMPLANT
GLOVE INDICATOR 7.5 STRL GRN (GLOVE) ×3 IMPLANT
GOWN STRL REUS W/ TWL LRG LVL3 (GOWN DISPOSABLE) ×2 IMPLANT
GOWN STRL REUS W/TWL LRG LVL3 (GOWN DISPOSABLE) ×4
KIT TURNOVER CYSTO (KITS) ×3 IMPLANT
PACK DNC HYST (MISCELLANEOUS) ×3 IMPLANT
PAD OB MATERNITY 4.3X12.25 (PERSONAL CARE ITEMS) ×3 IMPLANT
PAD PREP 24X41 OB/GYN DISP (PERSONAL CARE ITEMS) ×3 IMPLANT

## 2019-09-14 NOTE — Progress Notes (Signed)
   09/14/19 0300  Clinical Encounter Type  Visited With Patient and family together  Visit Type Initial;Death  Referral From Nurse  Stress Factors  Patient Stress Factors Loss  Family Stress Factors Loss  Ch received a call to support a grieving mother. The father was at bedside and the care team was attending to the pt. Ch provided emotional support and positive affirmation. Pt was in physical discomfort, which made it difficult for her to engage in a conversation. Pt wishes that the ch returns later in the morning when she is feeling better. Unit ch will follow up.

## 2019-09-14 NOTE — Transfer of Care (Signed)
Immediate Anesthesia Transfer of Care Note  Patient: Cathy Joseph  Procedure(s) Performed: DILATATION AND CURETTAGE (N/A Vagina )  Patient Location: PACU and Nursing Unit  Anesthesia Type:General  Level of Consciousness: awake, alert , oriented and patient cooperative  Airway & Oxygen Therapy: Patient Spontanous Breathing  Post-op Assessment: Report given to RN  Post vital signs: stable  Last Vitals:  Vitals Value Taken Time  BP 97/56 09/14/19 0632  Temp    Pulse 86 09/14/19 0632  Resp    SpO2 100 % 09/14/19 0630  Vitals shown include unvalidated device data.  Last Pain:  Vitals:   09/14/19 0630  TempSrc:   PainSc: 10-Worst pain ever      Patients Stated Pain Goal: 0 (49/67/59 1638)  Complications: No apparent anesthesia complications

## 2019-09-14 NOTE — Progress Notes (Signed)
Patient ID: Cathy Joseph, female   DOB: 1991-03-25, 28 y.o.   MRN: [id number] Records reviewed   Pt with significant EBL  . Bakri placed . Pt now with some SOB . VSS Urine output good .  Bakri output 100 cc O: 98/58 p=66  Lungs CTA cv RRR  adb soft NT  Fundus firm  A: symptomatic anemia  P:  Stat CBC , CMP

## 2019-09-14 NOTE — Op Note (Addendum)
Cathy Joseph PROCEDURE DATE:  09/14/2019  PREOPERATIVE DIAGNOSIS: Retained placenta POSTOPERATIVE DIAGNOSIS: The same, postpartum hemorrhage  PROCEDURE:   Dilation and Curettage under ultrasound guidance SURGEON:  Dr. Benjaman Kindler ASSISTANT: CST, Linda Hedges, CNM for ultrasound assistance  INDICATIONS: 28 y.o.  [id number] with retained placenta and hemorrhage after SVD at [redacted]w[redacted]d, needing emergent surgical completion.  Risks of surgery were discussed with the patient and her friend including but not limited to: bleeding which may require transfusion; infection which may require antibiotics; injury to uterus or surrounding organs; need for additional procedures including laparotomy or laparoscopy; possibility of intrauterine scarring which may impair future fertility; and other postoperative/anesthesia complications. Written informed consent was obtained.  Prenatal labs and records not available at the time of surgery.  The patient had admission labs had been ordered but not drawn at time of surgery. Covid testing pending, so full protection PPE used by all in the room.  Lab came to draw type and screen during procedure, and collected other labs at the same time as well.   FINDINGS:  Fundal placenta with significant clots and blood.  Total EBL about 239ml.    ANESTHESIA:    General INTRAVENOUS FLUIDS:  654ml of LR ESTIMATED BLOOD LOSS:  299ml in the OR PLUS approx 828ml in the LDR  SPECIMENS:  Placental fragments sent to pathology COMPLICATIONS:  None immediate.  PROCEDURE DETAILS:  The patient was then taken to the operating room where general anesthesia was administered and was found to be adequate.  After an adequate timeout was performed, she was placed in the dorsal lithotomy position and examined; then prepped and draped in the sterile manner.   A vaginal speculum was then placed in the patient's vagina and a ring forceps was applied to the anterior lip of the cervix.  The cervix  was already about 3 cm dilated; ring forceps were advanced into the uterus to grasp and remove the placenta in fragments under ultrasound guidance.  A large banjo curette was then advanced into the uterus.  A sharp curettage was then performed to confirm complete emptying of the uterus. There was significant bleeding noted so 1g TXA IV was administered.   The bleeding was noted to subside significantly. However, it did not resolve. No clots remained under ultrasound examination, and decision to apply a Bakri balloon was made. 326ml of sterile saline was used to fill the Bakri and fundal placemen was visually confirmed with ultrasound.    All instruments were removed from the patient's vagina.  Sponge and instrument counts were correct times two.  The patient tolerated the procedure well and was taken to the recovery area awake, extubated and in stable condition.  Given the amount of blood loss, patient will be observed overnight in the hospital and will receive blood transfusions as indicated.  Will discharge in the morning if she remains stable.

## 2019-09-14 NOTE — Progress Notes (Signed)
Placenta in situ with a closing cervix, now at 3cm. Unsuccessful attempt to remove with iv meds vaginally. Bleeding so far weighed at 784ml. Has received IM pitocin, iv pitocin hanging. Still bleeding minimally. Will proceed to OR for D&C. Discussed with patient and partner. OR and anesthesia aware.  Plan for 1 dose 2g ancef.

## 2019-09-14 NOTE — Progress Notes (Signed)
190ml removed from Bakri balloon with nursing assistance. Still 100cc in the collection bag, no new bleeding or sx.

## 2019-09-14 NOTE — H&P (Signed)
OB History & Physical   History of Present Illness:  Chief Complaint:   HPI:  Cathy Joseph is a 28 y.o. [id number] female at [redacted]w[redacted]d dated by early u/s.  She presents to L&D for spotting after using the toilet yesterday (09/13/19) morning.  Last night around 2000 something made her go to the restroom, she reached down and felt something protruding from her vagina and came in by EMS.      She reports:  -no leakage of fluid -no contractions  Pregnancy Issues: 1. H/o PTD 2. [id number]   Maternal Medical History:   Past Medical History:  Diagnosis Date  . Depression     History reviewed. No pertinent surgical history.  No Known Allergies  Prior to Admission medications   Medication Sig Start Date End Date Taking? Authorizing Provider  Prenatal Vit-Fe Fumarate-FA (MULTIVITAMIN-PRENATAL) 27-0.8 MG TABS tablet Take 1 tablet by mouth daily at 12 noon.   Yes [provider]  promethazine (PHENERGAN) 25 MG tablet Take 1 tablet (25 mg total) by mouth every 6 (six) hours as needed for nausea or vomiting. 06/15/18  Yes Minna Antis, MD  pyridoxine (B-6) 200 MG tablet Take 200 mg by mouth daily.   Yes [provider]  ibuprofen (ADVIL,MOTRIN) 600 MG tablet Take 1 tablet (600 mg total) by mouth every 8 (eight) hours as needed. Patient not taking: Reported on 09/14/2019 06/11/18   Tommi Rumps, PA-C  magic mouthwash w/lidocaine SOLN Take 5 mLs by mouth 4 (four) times daily. Patient not taking: Reported on 09/14/2019 04/21/19   Cuthriell, Delorise Royals, PA-C  penicillin v potassium (VEETID) 500 MG tablet Take 1 tablet (500 mg total) by mouth 4 (four) times daily. Patient not taking: Reported on 09/14/2019 06/11/18   Tommi Rumps, PA-C     Prenatal care site: [facility]  Social History: She  reports that she has never smoked. She has never used smokeless tobacco. She reports that she does not drink alcohol or use drugs.  Family History: family history includes Diabetes in  her paternal grandmother; Hypertension in her paternal grandmother.   Review of Systems: A full review of systems was performed and negative except as noted in the HPI.    Physical Exam:  Vital Signs: BP (!) 94/50 (BP Location: Left Arm)   Pulse 79   Temp 98 F (36.7 C) (Oral)   Resp 18   Ht 5\' 3"  (1.6 m)   Wt 104.3 kg   LMP 04/21/2019   BMI 40.74 kg/m   General:   alert and cooperative  Skin:  normal  Neurologic:    Alert & oriented x 3  Lungs:   normal effort  Heart:   regular rate and rhythm  Abdomen:  normal findings: soft, non-tender  Pelvis:  fetal feet hanging out of introitus   FHT:  Baseline 170 BPM, min variability,   Presentations: breech  Extremities: : non-tender, symmetric     Results for orders placed or performed during the hospital encounter of 09/14/19 (from the past 24 hour(s))  Urinalysis, Routine w reflex microscopic     Status: Abnormal   Collection Time: 09/14/19  1:20 AM  Result Value Ref Range   Color, Urine YELLOW (A) YELLOW   APPearance CLEAR (A) CLEAR   Specific Gravity, Urine 1.011 1.005 - 1.030   pH 7.0 5.0 - 8.0   Glucose, UA NEGATIVE NEGATIVE mg/dL   Hgb urine dipstick NEGATIVE NEGATIVE   Bilirubin Urine NEGATIVE NEGATIVE   Ketones,  ur NEGATIVE NEGATIVE mg/dL   Protein, ur NEGATIVE NEGATIVE mg/dL   Nitrite NEGATIVE NEGATIVE   Leukocytes,Ua NEGATIVE NEGATIVE    Pertinent Results:  Prenatal Labs: Blood type/Rh O pos    Assessment:  Cathy Joseph is a 28 y.o. G3P0202 female at [redacted]w[redacted]d with preterm delivery.   Plan:  1. Admit to Labor & Delivery; consents reviewed and obtained  2. Fetal Well being  21.[redacted] week gestation BL 170 bpm Immanent delivery   3. Routine OB: - Prenatal labs unavailable - CBC & T&S on admit  4. Emotional support - Baby with parents - [place] requested   Linda Hedges, CNM 09/14/2019 3:17 AM

## 2019-09-14 NOTE — Anesthesia Preprocedure Evaluation (Signed)
Anesthesia Evaluation  Patient identified by MRN, date of birth, ID band Patient awake    Reviewed: Allergy & Precautions, NPO status , Patient's Chart, lab work & pertinent test results  Airway Mallampati: II  TM Distance: >3 FB     Dental   Pulmonary neg pulmonary ROS,    Pulmonary exam normal        Cardiovascular negative cardio ROS Normal cardiovascular exam     Neuro/Psych PSYCHIATRIC DISORDERS Depression negative neurological ROS     GI/Hepatic negative GI ROS, Neg liver ROS,   Endo/Other  Morbid obesity  Renal/GU negative Renal ROS  Female GU complaint     Musculoskeletal negative musculoskeletal ROS (+)   Abdominal Normal abdominal exam  (+)   Peds negative pediatric ROS (+)  Hematology negative hematology ROS (+)   Anesthesia Other Findings Past Medical History: No date: Depression Retained products after vaginal delivery  Reproductive/Obstetrics                             Anesthesia Physical Anesthesia Plan  ASA: II and emergent  Anesthesia Plan: General   Post-op Pain Management:    Induction: Intravenous, Rapid sequence and Cricoid pressure planned  PONV Risk Score and Plan:   Airway Management Planned: Oral ETT  Additional Equipment:   Intra-op Plan:   Post-operative Plan: Extubation in OR  Informed Consent: I have reviewed the patients History and Physical, chart, labs and discussed the procedure including the risks, benefits and alternatives for the proposed anesthesia with the patient or authorized representative who has indicated his/her understanding and acceptance.     Dental advisory given  Plan Discussed with: CRNA and Surgeon  Anesthesia Plan Comments:         Anesthesia Quick Evaluation

## 2019-09-14 NOTE — Progress Notes (Signed)
Post Partum Day DOS  BAkri placed . Pt HCT stable and she feeling ok   Subjective: no complaints  Objective: Blood pressure 110/61, pulse 68, temperature 98.8 F (37.1 C), temperature source Axillary, resp. rate 18, height 5\' 3"  (1.6 m), weight 104.3 kg, last menstrual period 04/21/2019, SpO2 98 %.  Physical Exam:  General: alert and cooperative Lochia: appropriate, Bakri removed  Uterine Fundus: firm  DVT Evaluation: No evidence of DVT seen on physical exam.  Recent Labs    09/14/19 0528 09/14/19 1104  HGB 8.8* 8.8*  HCT 26.9* 26.9*  urine output 35 cc/ hr   Assessment/Plan: Bakri out   continue IVF and foley given marginal output  Repeat labs in am   LOS: 0 days   Gwen Her Jasmeen Fritsch 09/14/2019, 4:42 PM

## 2019-09-14 NOTE — Anesthesia Post-op Follow-up Note (Signed)
Anesthesia QCDR form completed.        

## 2019-09-14 NOTE — OB Triage Note (Signed)
Pt is a G3P2 seen at 24 wks w/ c/o of vaginal bleeding that started approximately 2100 and stayed consistent up until this point. Pt states she has noticed decreased fetal movement, denies LOF. Pt rates her pain a 9/10. FHT 166. Monitors applied and assessing.

## 2019-09-14 NOTE — Anesthesia Procedure Notes (Signed)
Procedure Name: Intubation Date/Time: 09/14/2019 4:44 AM Performed by: Lendon Colonel, CRNA Pre-anesthesia Checklist: Patient identified, Patient being monitored, Timeout performed, Emergency Drugs available and Suction available Patient Re-evaluated:Patient Re-evaluated prior to induction Oxygen Delivery Method: Circle system utilized Preoxygenation: Pre-oxygenation with 100% oxygen Induction Type: IV induction, Rapid sequence and Cricoid Pressure applied Laryngoscope Size: 3 and McGraph Grade View: Grade I Tube type: Oral Tube size: 7.0 mm Number of attempts: 1 Airway Equipment and Method: Stylet Placement Confirmation: ETT inserted through vocal cords under direct vision,  positive ETCO2 and breath sounds checked- equal and bilateral Secured at: 21 cm Tube secured with: Tape Dental Injury: Teeth and Oropharynx as per pre-operative assessment

## 2019-09-14 NOTE — Anesthesia Postprocedure Evaluation (Signed)
Anesthesia Post Note  Patient: Cathy Joseph  Procedure(s) Performed: DILATATION AND CURETTAGE (N/A Vagina )  Patient location during evaluation: PACU Anesthesia Type: General Level of consciousness: awake and alert and oriented Pain management: pain level controlled Vital Signs Assessment: post-procedure vital signs reviewed and stable Respiratory status: spontaneous breathing Cardiovascular status: blood pressure returned to baseline Anesthetic complications: no     Last Vitals:  Vitals:   09/14/19 0320 09/14/19 0630  BP: (!) 95/52   Pulse: 92   Resp: 18   Temp: 37.1 C   SpO2:  100%    Last Pain:  Vitals:   09/14/19 0630  TempSrc:   PainSc: 10-Worst pain ever                 Kerby Borner

## 2019-09-15 ENCOUNTER — Ambulatory Visit: Payer: Medicaid Other

## 2019-09-15 LAB — CBC
HCT: 21.3 % — ABNORMAL LOW (ref 36.0–46.0)
Hemoglobin: 7 g/dL — ABNORMAL LOW (ref 12.0–15.0)
MCH: 28.7 pg (ref 26.0–34.0)
MCHC: 32.9 g/dL (ref 30.0–36.0)
MCV: 87.3 fL (ref 80.0–100.0)
Platelets: 167 K/uL (ref 150–400)
RBC: 2.44 MIL/uL — ABNORMAL LOW (ref 3.87–5.11)
RDW: 13.2 % (ref 11.5–15.5)
WBC: 13.1 K/uL — ABNORMAL HIGH (ref 4.0–10.5)
nRBC: 0 % (ref 0.0–0.2)

## 2019-09-15 LAB — HEMOGLOBIN AND HEMATOCRIT, BLOOD
HCT: 23 % — ABNORMAL LOW (ref 36.0–46.0)
Hemoglobin: 7.3 g/dL — ABNORMAL LOW (ref 12.0–15.0)

## 2019-09-15 LAB — URINE CULTURE

## 2019-09-15 MED ORDER — IBUPROFEN 600 MG PO TABS: 600.0000 mg | ORAL_TABLET | Freq: Four times a day (QID) | ORAL | 0 refills | Status: DC

## 2019-09-15 MED ORDER — SERTRALINE HCL 50 MG PO TABS: 50.0000 mg | ORAL_TABLET | Freq: Every day | ORAL | 11 refills | Status: AC

## 2019-09-15 MED ORDER — ACETAMINOPHEN 325 MG PO TABS: 1000.0000 mg | ORAL_TABLET | Freq: Four times a day (QID) | ORAL | 0 refills | Status: DC

## 2019-09-15 MED ORDER — SODIUM CHLORIDE 0.9 % IV SOLN
200.0000 mg | Freq: Once | INTRAVENOUS | Status: AC
  Administered 2019-09-15: 200 mg via INTRAVENOUS
  Filled 2019-09-15: qty 10

## 2019-09-15 MED ORDER — VITAMIN C 500 MG PO TABS
1000.0000 mg | ORAL_TABLET | Freq: Three times a day (TID) | ORAL | Status: DC
  Administered 2019-09-15 (×2): 1000 mg via ORAL
  Filled 2019-09-15 (×2): qty 2

## 2019-09-15 MED ORDER — ALPRAZOLAM 0.5 MG PO TABS
0.5000 mg | ORAL_TABLET | Freq: Once | ORAL | Status: AC
  Administered 2019-09-15: 0.5 mg via ORAL

## 2019-09-15 MED ORDER — IBUPROFEN 600 MG PO TABS: 600.0000 mg | ORAL_TABLET | Freq: Four times a day (QID) | ORAL | 1 refills | Status: DC

## 2019-09-15 MED ORDER — ALPRAZOLAM 0.5 MG PO TABS
ORAL_TABLET | ORAL | Status: AC
  Filled 2019-09-15: qty 1

## 2019-09-15 NOTE — Progress Notes (Signed)
UOP improved overnight Remove foley Get OOB  Acute blood loss anemia Repeat CBC at noon If <7.0 give .PRBCs If 7.0 or above will give IV iron  Social Work consult for resources [place]    ----- Larey Days, MD, [place] Attending Obstetrician and Gynecologist [facility], Department of [facility]

## 2019-09-15 NOTE — Progress Notes (Signed)
CSW followed up with MOB to assure that all needs are met at this time. Per MOB "I feel much better now". CSW expressed understanding and advised MOB that therapy resources have been placed on her paperwork. MOB thanked CSW and reported no other needs at this time.       Virgie Dad Ilaria Much, MSW, LCSW [facility] [place] [phone]

## 2019-09-15 NOTE — Discharge Summary (Signed)
Obstetrical Discharge Summary  Patient Name: Cathy Joseph DOB: 1991/04/23 MRN: [id number]  Date of Admission: 09/14/2019 Date of Delivery: 09/14/2019 Delivered by: Benjaman Kindler, MD Date of Discharge: 09/15/2019  Primary OB: Princella Ion [id number]:[id number]'P last menstrual period was 04/21/2019. EDC Estimated Date of Delivery: 01/19/20 Gestational Age at Delivery: [redacted]w[redacted]d   Antepartum complications:  1. History of premature delivery @ 24wks, on 17P 2. Depression 3. trichomonas  4. UTI with incomplete treatment 5. GERD, NV 6. Elevated transaminases  7. Varicella non-immune 8. Smoker - cigarettes and marijuana 9. No record of anatomy scan - denied by insurer.   Admitting Diagnosis: previable breech vaginal delivery, cervical insufficiency  Secondary Diagnosis: Patient Active Problem List   Diagnosis Date Noted  . Vaginal bleeding affecting early pregnancy 09/14/2019  . Preterm delivery 09/14/2019    Augmentation: none Complications: Stillborn, postpartum hemorrhage Intrapartum complications/course: see delivery note and op note Delivery Type: spontaneous vaginal delivery breech Anesthesia: none Placenta: spontaneous Laceration: none Episiotomy: none Newborn Data: Live born female  Birth Weight: 13 oz (369 g) APGAR: 0, 0  Newborn Delivery   Birth date/time: 09/14/2019 02:07:00 Delivery type: Vaginal, Spontaneous      Postpartum Procedures: dilation and curettage, bakari balloon, iron infusion  Post partum course:  Patient had an uncomplicated postpartum course.  She was seen by chaplain and social work.  Her bakari balloon was deflated and removed without incident.  Her hemoglobin stabilized.  By time of discharge on PPD#1, her pain was controlled on oral pain medications; she had appropriate lochia and was ambulating, voiding without difficulty and tolerating regular diet.  She was deemed stable for discharge to home.    She was discharged on an anti-depressant and  recommendations to see a grief counselor and start counseling for depression.  Edinburgh:  19  Discharge Physical Exam:  BP 109/67 (BP Location: Left Arm)   Pulse 86   Temp 98 F (36.7 C) (Oral)   Resp 18   Ht 5\' 3"  (1.6 m)   Wt 104.3 kg   LMP 04/21/2019   SpO2 100%   BMI 40.74 kg/m   General: NAD CV: RRR Pulm: CTABL, nl effort ABD: s/nd/nt, fundus firm and below the umbilicus Lochia: moderate DVT Evaluation: LE non-ttp, no evidence of DVT on exam.  Hemoglobin  Date Value Ref Range Status  09/15/2019 7.3 (L) 12.0 - 15.0 g/dL Final   HGB  Date Value Ref Range Status  01/17/2015 12.2 12.0 - 16.0 g/dL Final   HCT  Date Value Ref Range Status  09/15/2019 23.0 (L) 36.0 - 46.0 % Final    Comment:    Performed at [facility], [address] [place], [place] [address]  01/17/2015 38.6 35.0 - 47.0 % Final     Disposition: stable, discharge to home. Baby Feeding: n/a Baby Disposition: morgue  Rh Immune globulin given: n/a Rubella vaccine given: no Flu vaccine given in AP or PP setting: AP  Contraception: undecided   Prenatal Labs: Blood type/Rh O pos  Antibody screen neg  Rubella Immune  Varicella Non-Immune  RPR NR  HBsAg Neg  HIV NR  GC neg  Chlamydia neg  Genetic screening negative  1 hour GTT 120  3 hour GTT   GBS n/a   COVID: neg     Plan:  ROSEALYN LITTLE was discharged to home in fair condition. Follow-up appointment with Dr Leafy Ro in 1-2 weeks.  Discharge Medications: Allergies as of 09/15/2019   No Known Allergies  Medication List    STOP taking these medications   magic mouthwash w/lidocaine Soln   penicillin v potassium 500 MG tablet Commonly known as: VEETID     TAKE these medications   acetaminophen 325 MG tablet Commonly known as: Tylenol Take 3 tablets (975 mg total) by mouth every 6 (six) hours.   ibuprofen 600 MG tablet Commonly known as: ADVIL Take 1 tablet (600 mg total) by mouth every 6 (six)  hours. Start taking on: September 16, 2019 What changed:   when to take this  reasons to take this   multivitamin-prenatal 27-0.8 MG Tabs tablet Take 1 tablet by mouth daily at 12 noon.   promethazine 25 MG tablet Commonly known as: PHENERGAN Take 1 tablet (25 mg total) by mouth every 6 (six) hours as needed for nausea or vomiting.   pyridoxine 200 MG tablet Commonly known as: B-6 Take 200 mg by mouth daily.       Follow-up Information    Christeen Douglas, MD In 2 weeks.   Specialty: Obstetrics and Gynecology Why: For postop check Contact information: [address] [place] [address] [phone]        [facility] Follow up.   Contact information: [address] [place] [address] [phone]        [facility] [employer] Follow up.   Contact information: [address] [place] [address] [phone]        [facility] Follow up.   Contact information: [address] [place] [address] [phone]        [facility] Follow up.   Specialty: PALLIATIVE CARE Contact information: [address] [place] [address] [phone]          Signed: ----- Ranae Plumber, MD, FACOG Attending Obstetrician and Gynecologist [facility], Department of OB/GYN [facility]

## 2019-09-15 NOTE — Progress Notes (Signed)
CSW received consult due to score 19 on Edinburgh Depression Screen.    CSW spoke with MOB and FOB at bedside. Upon entering the room, CSW offered support to MOB and FOB regarding their loss of infant. MOB was very tearful but receptive to CSW coming in and speaking with her. CSW advised MOB that CSW was there to offer further support and give information. CSW asked if it was a good time for CSW to speak with MOB. MOB and FOB both were agreeable to speaking with CSW at this time. CSW advised MOB and FOB of CSW's role and CSW's reason for coming. CSW allowed MOB and FOB to start the conversation where they wanted. MOB began by expressing to CSW that none of this was expected and that she feels guilty as she allowed family members to stress her out recently. CSW validated MOB's feelings and continued to listen to MOB and her feelings. MOB went on to tell CSW that she would have never imagined that she would be in this situation. As MOB, began to cry, FOB took PPD information from [facility] and reported to [place] that "she feels really guilty". MOB stopped in that moment and asked CSW "well what do I do when Im already feeling these things". CSW encouraged MOB that at this time it is okay for MOB and Fob to both sit with their feelings. CSW advised MOB that CSW nor other staff are asking that they dismiss the loss at hand but rather reach out to support as needed. CSW went on to tell MOB and FOB that CSW is willing to sit and talk with them and help process at best what has taken places. MOB thanked CSW and went on to tell CSW that she feels a sense of numbness or that this isn't really happening. CSW validated these feelings of MOB and expressed to her that healing can take time and again no one is telling her or FOB when or how fast they should heal from this loss. Per MOB she as well as her children were looking forward to having infant join there family. CSW asked that MOB be gentle with herself as much as possible but  also advised her that it is normal to have "expectations" or "hopes" for certain things in life. CSW continue to validated MOB's feelings of "unrealness" and her right to feel her emotions at this time.   FOB advised CSW that around 5am this morning, MOB was up crying. FOB reported that he got up and comforter MOB. In speaking with MOB about this, MOB reported "I was just expecting her to wake up wanting to eat, needing to be changed, and so forth. I talked to her and comfort her while she was in my belly and now Im not sure how to handle this". CSW repeatedly encouraged MOB to acknowledges those feelings in order to move on and accept what has happened. CSW as again told MOB and FOB that there is no time frame from healing, but MOB should be aware of her grieving to make sure that MOB is not falling into a depression which can result in other down hill spirals.    CSW suggested to [facility] counseling resources for herself and her other children. MOB very receptive of this and thanked CSW. CSW has placed information for follow up therapy on MOB's discharge information. CSW will follow back up with MOB once again to address further needs and offer other supports.   CSW provided education regarding Baby  Blues vs PMADs and provided MOB with resources for mental health follow up.  CSW encouraged MOB to evaluate her mental health throughout the postpartum period with the use of the New Mom Checklist developed by Postpartum Progress as well as the [place] Postnatal Depression Scale and notify a medical professional if symptoms arise.      Claude Manges Tenesia Escudero, MSW, LCSW [facility] [place] [phone]

## 2019-09-15 NOTE — Discharge Instructions (Signed)
Please take Tylenol ( ) and Ibuprofen ( ) together, every 6 hours for pain for the first few days.Then take as needed. Please also follow up with Dr. Dalbert Garnet in the next week to touch base about how you are doing.  We want to make sure you are doing ok, and if not, find out how we can help.   The anti-depression medication we are starting is called Sertraline.  Please take 1/2 pill for the first 6 days, to taper up and get your body used to taking this pill.  On the 7th day you will take a full dose ( ).  Most people prefer to take this in the morning, but if you find yourself more sleepy while taking it, then take it at night.  Whichever works for you!       Vaginal Delivery, Care After This sheet gives you information about how to care for yourself after your delivery. Your health care provider may also give you more specific instructions. If you have problems or questions, contact your health care provider. What can I expect after the procedure? After delivery, it is common to have:  Soreness in your abdomen, your vagina, and the area between the opening of your vagina and your anus (perineum).  Tiredness (fatigue).  Cramps.  Breast tenderness related to engorgement.  Some bleeding and discharge from your vagina. This may continue for about 6 weeks. The bleeding and discharge will start out red, then become pink, then yellow, and finally white.  Tenderness in your vagina or perineum if you had an episiotomy or a vaginal tear. This may last several weeks.  Emotions that change quickly. Common emotions include: ? Sadness. ? Anger. ? Denial. ? Guilt. ? Depression. Follow these instructions at home: Vaginal and perineal care  Keep your perineum clean and dry as told by your health care provider.  Wipe from front to back when you use the toilet.  If you have an episiotomy or a vaginal tear, check for signs of infection, such as: ? Increasing redness, swelling, or pain  in your perineal area. ? Pus or bad-smelling discharge coming from your wound or vagina.  To relieve pain at the wound area or pain caused by hemorrhoids, try taking a warm sitz bath 2-3 times a day.  Do not use tampons or douche until your health care provider says it is okay. Medicines  Take over-the-counter and prescription medicines only as told by your health care provider.  If you were prescribed an antibiotic medicine, take it as told by your health care provider. Do not stop taking the antibiotic even if you start to feel better. Eating and drinking   Drink enough fluids to keep your urine pale yellow.  Eat high-fiber foods every day. These foods may help prevent or relieve constipation. High-fiber foods include: ? Whole grain cereals and breads. ? Brown rice. ? Beans. ? Fresh fruits and vegetables. Activity  If possible, have someone help you with your household activities for at least a few days after you leave the hospital.  Return to your normal activities as told by your health care provider. Ask your health care provider what activities are safe for you.  Rest as much as possible.  Talk with your health care provider about when you can engage in sexual activity. This may depend on your: ? Risk of infection. ? Rate of healing. ? Comfort and desire to engage in sexual activity. Emotional support  Consider seeking support for your loss. Some forms  of support that you might consider include your religious leader, friends, family, a Medical illustrator, or a bereavement support group. General instructions  Wear a supportive and well-fitting bra.  If you pass a blood clot, save it and call your health care provider to discuss. Do not flush blood clots down the toilet before you get instructions from your health care provider.  Keep all of your scheduled postpartum visits. At these visits, your health care provider will check to make sure that you are healing, both  physically and emotionally. Contact a health care provider if:  You feel sad or depressed.  You are having trouble eating or sleeping.  You lose interest in activities you used to enjoy.  You pass a blood clot from your vagina.  You have pus or a bad-smelling discharge coming from your wound or vagina.  You have increasing redness, swelling, or pain in your perineal area.  You feel pain or burning when you urinate.  You urinate more often than normal.  You have a fever.  Your breasts become hard, red, or painful.  You are dizzy or light-headed.  You have a rash.  You feel nauseous or you vomit.  You have not had a menstrual period by the 12th week after delivery. Get help right away if:  You have persistent pain that is not relieved by comfort measures or medicines.  You have chest pain or trouble breathing.  You have blurred vision or you see spots.  You have a severe headache.  You faint.  You have sudden, severe leg pain.  You bleed from your vagina so much that you fill more than one sanitary pad in one hour. Bleeding should not be heavier than your heaviest period.  You have thoughts of hurting yourself. If you ever feel like you may hurt yourself or others, or have thoughts about taking your own life, get help right away. You can go to your nearest emergency department or call:  Your local emergency services (911 in the [place]  A suicide crisis helpline, such as the [facility] at (213)086-6031. This is open 24 hours a day. Summary  Take over-the-counter and prescription medicines only as told by your health care provider.  Return to your normal activities as told by your health care provider. Ask your health care provider what activities are safe for you.  Consider getting support for your loss. Sources of support include religious leaders, friends, family, professional counselors, and bereavement support groups.  Keep all  follow-up visits as told by your health care provider. This is important. This information is not intended to replace advice given to you by your health care provider. Make sure you discuss any questions you have with your health care provider. Document Released: 02/07/2014 Document Revised: 10/08/2017 Document Reviewed: 01/02/2017 Elsevier Patient Education  [place].   Fetal Demise Fetal demise, also called intrauterine fetal demise (IUFD) or fetal death, is the loss of a baby after 20 weeks of pregnancy. When this occurs, the unborn baby (fetus) does not show any signs of life, such as a heartbeat or breathing. Most women recognize the loss of the baby soon after it happens, due to the lack of fetal movement. Some women experience spontaneous labor shortly after fetal demise resulting in a stillborn birth (stillbirth). What are the causes? The cause is often unknown. Sometimes fetal demise may be caused by:  A problem with the umbilical cord or placenta.  A birth defect or genetic  disorder in the baby.  A serious infection during pregnancy.  The baby not growing enough in the womb (poor fetal growth).  Long-term health problems in the mother, such as diabetes, lupus, kidney disease, thyroid disease, or gallbladder disease.  High blood pressure (hypertension) or a related disorder such as preeclampsia. What increases the risk? You may have a higher risk of fetal demise if you:  Use tobacco, drugs, or alcohol during pregnancy.  Are pregnant with two or more babies.  Are older than age 69.  Are of African-American descent.  Are pregnant for the first time.  Are obese. What are the signs or symptoms? When you experience fetal demise, your abdomen will stop getting bigger and you will not feel your baby move. Fetal demise increases your risk of certain complications, such as:  Severe bleeding due to problems with blood clotting (disseminated intravascular  coagulation).  Infection in the uterus (intrauterine infection).  Increased bleeding from the uterus. How is this diagnosed? A physical exam may show signs of fetal demise. The diagnosis must be confirmed with an ultrasound to check for:  The lack of a heartbeat.  The lack of fetal movement. How is this treated? Treatment for fetal demise may include:  Waiting for your body's natural process of labor to begin (expectant management). You will be monitored closely during this time for signs of infection or other problems. You may have the option to return home with instructions about what to expect as your body continues to naturally release your baby and placenta. If labor does not start on its own, you will need a labor induction.  Using medicines to start your labor (labor induction). If labor has not started, you may choose to have your labor induced with medicines and be monitored in the hospital setting. This will cause you to have contractions in order to deliver your baby and placenta.  Doing surgery to deliver your baby and placenta through an incision in your abdomen and your uterus (cesarean delivery, or C-section). After delivery:  Parents are encouraged to see and hold their baby. This may help you with the grieving process.  You may consider creating keepsakes, such as taking a photo or getting an imprint of your baby's footprint or handprint.  Request to have any religious or cultural ceremonies that you prefer. Work with your health care team to make arrangements for the handling of your baby's remains.  Your baby may be examined to determine if problems were present that could happen again in a future pregnancy.  You may be given antibiotic medicine to treat infection if you lost your baby because of an infection.  If you have Rh-negative blood, you may be given a injection of Rho(D) immune globulin to help prevent problems with future pregnancies. Follow these  instructions at home: Medicines  Take over-the-counter and prescription medicines only as told by your health care provider.  If you were prescribed an antibiotic, take it as told by your health care provider. Do not stop taking the antibiotic even if you start to feel better. General instructions  Your health care provider may recommend working with a trained grief counselor who can help you work through your emotions. It may help to work with this counselor before and after delivery.  It may be helpful to meet with others who have experienced fetal demise. Ask your health care provider about support groups and resources.  Talk with your health care provider about any activity restrictions, including sexual activity.  Use a sanitary napkin for bleeding and discharge from your vagina. Do not douche or use tampons until your health care provider says that this is safe.  Keep all follow-up visits as told by your health care provider. This is important.  When you are ready, meet with your health care provider to discuss steps to take for a future pregnancy. Contact a health care provider if you:  Continue to experience grief, sadness, or lack of motivation for everyday activities, and those feelings do not improve over time.  Have painful, hard, or reddened breasts.  Have not had a menstrual period by the 12th week after delivery. Get help right away if you have:  Heavy vaginal bleeding that soaks more than 1 regular sanitary pad in an hour.  Chest pain.  A fever or chills.  Shortness of breath.  Vaginal bleeding that continues for more than 6 weeks after delivery.  Pain while urinating.  Blood in your urine.  Bad-smelling vaginal discharge.  Abdominal pain.  Leg pain, swelling, or redness.  A severe headache.  Changes in your vision.  Feelings of sadness that take over your thoughts, or you have thoughts of hurting yourself. If you ever feel like you may hurt  yourself or others, or have thoughts about taking your own life, get help right away. You can go to your nearest emergency department or call:  Your local emergency services (911 in the [place]  A suicide crisis helpline, such as the National Suicide Prevention Lifeline at 803-016-1214. This is open 24 hours a day. Summary  Fetal demise, also called intrauterine fetal demise (IUFD) or fetal death, is the loss of a baby after 20 weeks of pregnancy.  You may be at higher risk for fetal demise if you use tobacco, drugs, or alcohol during pregnancy.  Treatment may include waiting for your body's natural process of labor to begin, using medicine to start labor (labor induction), or doing surgery to deliver your baby (cesarean delivery, or C-section).  Your health care provider may recommend working with a trained grief counselor who can help you work through your emotions. This information is not intended to replace advice given to you by your health care provider. Make sure you discuss any questions you have with your health care provider. Document Released: 12/20/2008 Document Revised: 09/05/2017 Document Reviewed: 08/05/2017 Elsevier Patient Education  2020 Elsevier Inc.    Dilation and Curettage or Vacuum Curettage, Care After This sheet gives you information about how to care for yourself after your procedure. Your health care provider may also give you more specific instructions. If you have problems or questions, contact your health care provider. What can I expect after the procedure? After your procedure, it is common to have:  Mild pain or cramping.  Some vaginal bleeding or spotting. These may last for up to 2 weeks after your procedure. Follow these instructions at home: Activity   Do not drive or use heavy machinery while taking prescription pain medicine.  Avoid driving for the first 24 hours after your procedure.  Take frequent, short walks, followed by rest periods,  throughout the day. Ask your health care provider what activities are safe for you. After 1-2 days, you may be able to return to your normal activities.  Do not lift anything heavier than 10 lb (4.5 kg) until your health care provider approves.  For at least 2 weeks, or as long as told by your health care provider, do not: ? Douche. ? Use tampons. ?  Have sexual intercourse. General instructions   Take over-the-counter and prescription medicines only as told by your health care provider. This is especially important if you take blood thinning medicine.  Do not take baths, swim, or use a hot tub until your health care provider approves. Take showers instead of baths.  Wear compression stockings as told by your health care provider. These stockings help to prevent blood clots and reduce swelling in your legs.  It is your responsibility to get the results of your procedure. Ask your health care provider, or the department performing the procedure, when your results will be ready.  Keep all follow-up visits as told by your health care provider. This is important. Contact a health care provider if:  You have severe cramps that get worse or that do not get better with medicine.  You have severe abdominal pain.  You cannot drink fluids without vomiting.  You develop pain in a different area of your pelvis.  You have bad-smelling vaginal discharge.  You have a rash. Get help right away if:  You have vaginal bleeding that soaks more than one sanitary pad in 1 hour, for 2 hours in a row.  You pass large blood clots from your vagina.  You have a fever that is above 100.68F (38.0C).  Your abdomen feels very tender or hard.  You have chest pain.  You have shortness of breath.  You cough up blood.  You feel dizzy or light-headed.  You faint.  You have pain in your neck or shoulder area. This information is not intended to replace advice given to you by your health care  provider. Make sure you discuss any questions you have with your health care provider. Document Released: 09/20/2000 Document Revised: 09/05/2017 Document Reviewed: 04/25/2016 Elsevier Patient Education  2020 [employer]

## 2019-09-15 NOTE — Progress Notes (Signed)
CSW received consult due to IUFD.  CSW available for support secondary to [facility] and will await call from [place] before becoming involved.  CSW screening out referral at this time.    Virgie Dad Candace Begue, MSW, LCSW [facility] [place] [phone]

## 2019-09-16 NOTE — Progress Notes (Signed)
Patient discharged home. Infant was taken by hospital [facility]. Discharge instructions reviewed and given to pt. Pt verbalized understanding of instructions. Escorted out by staff.

## 2019-09-17 LAB — SURGICAL PATHOLOGY

## 2020-09-12 ENCOUNTER — Other Ambulatory Visit: Payer: Self-pay

## 2020-09-12 ENCOUNTER — Emergency Department
Admission: EM | Admit: 2020-09-12 | Discharge: 2020-09-12 | Disposition: A | Discharge status: Home / Self Care | Payer: Medicaid Other | Attending: Emergency Medicine | Admitting: Emergency Medicine

## 2020-09-12 ENCOUNTER — Encounter: Payer: Self-pay | Admitting: Intensive Care

## 2020-09-12 ENCOUNTER — Emergency Department: Payer: Medicaid Other

## 2020-09-12 DIAGNOSIS — M25562 Pain in left knee: Secondary | ICD-10-CM | POA: Insufficient documentation

## 2020-09-12 MED ORDER — IBUPROFEN 600 MG PO TABS
600.0000 mg | ORAL_TABLET | Freq: Once | ORAL | Status: AC
  Administered 2020-09-12: 600 mg via ORAL
  Filled 2020-09-12: qty 1

## 2020-09-12 MED ORDER — IBUPROFEN 600 MG PO TABS: 600.0000 mg | ORAL_TABLET | Freq: Three times a day (TID) | ORAL | 0 refills | Status: DC | PRN

## 2020-09-12 NOTE — Discharge Instructions (Signed)
You will need to follow-up with Dr. Joice Lofts who is on-call for orthopedics at [facility] if not improving in 1 to 2 weeks.  Wear the knee immobilizer anytime you are up walking for support and protection.  You may apply ice to your knee as needed for pain.  You do not have to wear the knee immobilizer when you are sleeping.  A prescription for ibuprofen was sent to your pharmacy to take with food.  This medication is for inflammation and pain.

## 2020-09-12 NOTE — ED Notes (Signed)
Pt given knee immobilizer and ace wrap given.

## 2020-09-12 NOTE — ED Triage Notes (Signed)
Patient c/o left knee pain since last week. Denies injury. Can ambulate with limp/pain per patient

## 2020-09-12 NOTE — ED Provider Notes (Signed)
[facility] Emergency Department Provider Note  ____________________________________________   First MD Initiated Contact with Patient 09/12/20 1119     (approximate)  I have reviewed the triage vital signs and the nursing notes.   HISTORY  Chief Complaint Knee Pain   HPI Cathy Joseph is a 29 y.o. female with complaint of left knee pain for 1 week.  Patient states there was no injury prior to her pain.  She denies any previous knee problems.  Patient has not taken any over-the-counter medication and reports that she walks with a limp due to her pain.  Currently she rates her pain as 10/10.     Past Medical History:  Diagnosis Date  . Depression     Patient Active Problem List   Diagnosis Date Noted  . Vaginal bleeding affecting early pregnancy 09/14/2019  . Preterm delivery 09/14/2019    Past Surgical History:  Procedure Laterality Date  . DILATION AND CURETTAGE OF UTERUS N/A 09/14/2019   Procedure: DILATATION AND CURETTAGE;  Surgeon: Christeen Douglas, MD;  Location: [facility] ORS;  Service: Gynecology;  Laterality: N/A;    Prior to Admission medications   Medication Sig Start Date End Date Taking? Authorizing Provider  ibuprofen (ADVIL) 600 MG tablet Take 1 tablet (600 mg total) by mouth every 8 (eight) hours as needed. 09/12/20   Tommi Rumps, PA-C  Prenatal Vit-Fe Fumarate-FA (MULTIVITAMIN-PRENATAL) 27-0.8 MG TABS tablet Take 1 tablet by mouth daily at 12 noon.    [provider]  pyridoxine (B-6) 200 MG tablet Take 200 mg by mouth daily.    [provider]  sertraline (ZOLOFT) 50 MG tablet Take 1 tablet (50 mg total) by mouth daily. 09/15/19   Ward, Elenora Fender, MD  promethazine (PHENERGAN) 25 MG tablet Take 1 tablet (25 mg total) by mouth every 6 (six) hours as needed for nausea or vomiting. 06/15/18 09/12/20  Minna Antis, MD    Allergies Patient has no known allergies.  Family History  Problem Relation Age of Onset    . Diabetes Paternal Grandmother   . Hypertension Paternal Grandmother     Social History Social History   Tobacco Use  . Smoking status: Never Smoker  . Smokeless tobacco: Never Used  Substance Use Topics  . Alcohol use: No  . Drug use: No    Review of Systems Constitutional: No fever/chills Cardiovascular: Denies chest pain. Respiratory: Denies shortness of breath. Gastrointestinal: No abdominal pain.  No nausea, no vomiting.   Musculoskeletal: Positive for left knee pain. Skin: Negative for rash. Neurological: Negative for headaches, focal weakness or numbness. ____________________________________________   PHYSICAL EXAM:  VITAL SIGNS: ED Triage Vitals  Enc Vitals Group     BP 09/12/20 0909 118/72     Pulse Rate 09/12/20 0909 (!) 56     Resp 09/12/20 0909 16     Temp 09/12/20 0909 98.1 F (36.7 C)     Temp Source 09/12/20 0909 Oral     SpO2 09/12/20 0909 100 %     Weight 09/12/20 0908 290 lb (131.5 kg)     Height 09/12/20 0908 5\' 3"  (1.6 m)     Head Circumference --      Peak Flow --      Pain Score 09/12/20 0908 10     Pain Loc --      Pain Edu? --      Excl. in GC? --     Constitutional: Alert and oriented. Well appearing and in no  acute distress. Eyes: Conjunctivae are normal.  Head: Atraumatic. Neck: No stridor.   Cardiovascular: Normal rate, regular rhythm. Grossly normal heart sounds.  Good peripheral circulation. Respiratory: Normal respiratory effort.  No retractions. Lungs CTAB. Musculoskeletal: Examination of left knee there is no gross deformity and no effusion is appreciated.  Range of motion is restricted secondary to patient's discomfort and resistance.  No warmth or erythema is present.  No abrasions or ecchymosis noted.  Ligaments are stable bilaterally.  Skin is intact.  Pulses are present distally. Neurologic:  Normal speech and language. No gross focal neurologic deficits are appreciated.  Skin:  Skin is warm, dry and intact. No rash  noted. Psychiatric: Mood and affect are normal. Speech and behavior are normal.  ____________________________________________   LABS (all labs ordered are listed, but only abnormal results are displayed)  Labs Reviewed - No data to display  RADIOLOGY I, Tommi Rumps, personally viewed and evaluated these images (plain radiographs) as part of my medical decision making, as well as reviewing the written report by the radiologist.   Official radiology report(s): DG Knee Complete 4 Views Left  Result Date: 09/12/2020 CLINICAL DATA:  Left knee pain since last week EXAM: LEFT KNEE - COMPLETE 4+ VIEW COMPARISON:  07/24/2016 FINDINGS: No evidence of fracture, dislocation, or joint effusion. No evidence of arthropathy or other focal bone abnormality. Soft tissues are unremarkable. IMPRESSION: Negative. Electronically Signed   By: Marnee Spring M.D.   On: 09/12/2020 11:07    ____________________________________________   PROCEDURES  Procedure(s) performed (including Critical Care):  Procedures Jones wrap was applied to the left knee as patient states that the knee immobilizer increased her pain.  Jones wrap was applied by Charity fundraiser.  ____________________________________________   INITIAL IMPRESSION / ASSESSMENT AND PLAN / ED COURSE  As part of my medical decision making, I reviewed the following data within the electronic MEDICAL RECORD NUMBER Notes from prior ED visits and [place] Controlled Substance Database  29 year old female presents to the ED with complaint of left knee pain for 1 week without history of injury.  Patient has not taken any over-the-counter medication.  Physical exam was benign.  X-rays were discussed with patient which were negative.  A knee immobilizer initially was ordered however patient states that she cannot tolerate it as it causes her more pain.  A Jones wrap was placed on her knee and she is to follow-up with Dr. Joice Lofts if any continued problems.  A prescription for  ibuprofen was sent to her pharmacy to take 3 times a day with food.  She is encouraged to ice her knee and elevate when possible.   ____________________________________________   FINAL CLINICAL IMPRESSION(S) / ED DIAGNOSES  Final diagnoses:  Acute pain of left knee     ED Discharge Orders         Ordered    ibuprofen (ADVIL) 600 MG tablet  Every 8 hours PRN        09/12/20 1151          *Please note:  Cathy Joseph was evaluated in Emergency Department on 09/12/2020 for the symptoms described in the history of present illness. She was evaluated in the context of the global COVID-19 pandemic, which necessitated consideration that the patient might be at risk for infection with the SARS-CoV-2 virus that causes COVID-19. Institutional protocols and algorithms that pertain to the evaluation of patients at risk for COVID-19 are in a state of rapid change based on information released by regulatory bodies  including the CDC and federal and state organizations. These policies and algorithms were followed during the patient's care in the ED.  Some ED evaluations and interventions may be delayed as a result of limited staffing during and the pandemic.*   Note:  This document was prepared using Dragon voice recognition software and may include unintentional dictation errors.    Tommi Rumps, PA-C 09/12/20 1209    Delton Pra[place], MD 09/12/20 [id number]

## 2020-09-26 ENCOUNTER — Encounter: Payer: Self-pay | Admitting: Emergency Medicine

## 2020-09-26 ENCOUNTER — Emergency Department
Admission: EM | Admit: 2020-09-26 | Discharge: 2020-09-26 | Disposition: A | Discharge status: Home / Self Care | Payer: Medicaid Other | Attending: Emergency Medicine | Admitting: Emergency Medicine

## 2020-09-26 ENCOUNTER — Other Ambulatory Visit: Payer: Self-pay

## 2020-09-26 DIAGNOSIS — K029 Dental caries, unspecified: Secondary | ICD-10-CM | POA: Insufficient documentation

## 2020-09-26 DIAGNOSIS — K0889 Other specified disorders of teeth and supporting structures: Secondary | ICD-10-CM | POA: Diagnosis present

## 2020-09-26 MED ORDER — LIDOCAINE VISCOUS HCL 2 % MT SOLN
15.0000 mL | Freq: Once | OROMUCOSAL | Status: AC
  Administered 2020-09-26: 15 mL via OROMUCOSAL
  Filled 2020-09-26: qty 15

## 2020-09-26 MED ORDER — AMOXICILLIN 500 MG PO CAPS: 500.0000 mg | ORAL_CAPSULE | Freq: Three times a day (TID) | ORAL | 0 refills | Status: DC

## 2020-09-26 MED ORDER — IBUPROFEN 600 MG PO TABS: 600.0000 mg | ORAL_TABLET | Freq: Three times a day (TID) | ORAL | 0 refills | Status: DC | PRN

## 2020-09-26 MED ORDER — OXYCODONE-ACETAMINOPHEN 7.5-325 MG PO TABS: 1.0000 | ORAL_TABLET | Freq: Four times a day (QID) | ORAL | 0 refills | Status: DC | PRN

## 2020-09-26 NOTE — ED Notes (Signed)
PT c/o R lower dental pain. States pain has been going on "for a while and has just gotten worse". Pt ambulatory to ED41A at this time, NAD noted, steady gait noted.

## 2020-09-26 NOTE — ED Triage Notes (Signed)
Patient to ER for c/o right lower dental pain.

## 2020-09-26 NOTE — ED Provider Notes (Signed)
[facility] Emergency Department Provider Note   ____________________________________________   Event Date/Time   First MD Initiated Contact with Patient 09/26/20 [phone]     (approximate)  I have reviewed the triage vital signs and the nursing notes.   HISTORY  Chief Complaint Dental Pain    HPI Cathy Joseph is a 29 y.o. female patient presents with dental pain secondary to multiple dental caries.  Patient states pain has increased in the past 2 days.  Patient denies fever associated with complaint.  Patient able to tolerate food and fluids.  Patient does not have a dentist.  Rates pain as a 10/10.  Described pain is "achy".  No relief over-the-counter anti-inflammatory medications.     Past Medical History:  Diagnosis Date  . Depression     Patient Active Problem List   Diagnosis Date Noted  . Vaginal bleeding affecting early pregnancy 09/14/2019  . Preterm delivery 09/14/2019    Past Surgical History:  Procedure Laterality Date  . DILATION AND CURETTAGE OF UTERUS N/A 09/14/2019   Procedure: DILATATION AND CURETTAGE;  Surgeon: Christeen Douglas, MD;  Location: [facility] ORS;  Service: Gynecology;  Laterality: N/A;    Prior to Admission medications   Medication Sig Start Date End Date Taking? Authorizing Provider  amoxicillin (AMOXIL) 500 MG capsule Take 1 capsule (500 mg total) by mouth 3 (three) times daily. 09/26/20   Joni Reining, PA-C  ibuprofen (ADVIL) 600 MG tablet Take 1 tablet (600 mg total) by mouth every 8 (eight) hours as needed. 09/12/20   Tommi Rumps, PA-C  ibuprofen (ADVIL) 600 MG tablet Take 1 tablet (600 mg total) by mouth every 8 (eight) hours as needed. 09/26/20   Joni Reining, PA-C  oxyCODONE-acetaminophen (PERCOCET) 7.5-325 MG tablet Take 1 tablet by mouth every 6 (six) hours as needed. 09/26/20   Joni Reining, PA-C  Prenatal Vit-Fe Fumarate-FA (MULTIVITAMIN-PRENATAL) 27-0.8 MG TABS tablet Take 1 tablet by mouth  daily at 12 noon.    [provider]  pyridoxine (B-6) 200 MG tablet Take 200 mg by mouth daily.    [provider]  sertraline (ZOLOFT) 50 MG tablet Take 1 tablet (50 mg total) by mouth daily. 09/15/19   Ward, Elenora Fender, MD  promethazine (PHENERGAN) 25 MG tablet Take 1 tablet (25 mg total) by mouth every 6 (six) hours as needed for nausea or vomiting. 06/15/18 09/12/20  Minna Antis, MD    Allergies Patient has no known allergies.  Family History  Problem Relation Age of Onset  . Diabetes Paternal Grandmother   . Hypertension Paternal Grandmother     Social History Social History   Tobacco Use  . Smoking status: Never Smoker  . Smokeless tobacco: Never Used  Substance Use Topics  . Alcohol use: No  . Drug use: No    Review of Systems Constitutional: No fever/chills Eyes: No visual changes. ENT: No sore throat.  2 pain. Cardiovascular: Denies chest pain. Respiratory: Denies shortness of breath. Gastrointestinal: No abdominal pain.  No nausea, no vomiting.  No diarrhea.  No constipation. Genitourinary: Negative for dysuria. Musculoskeletal: Negative for back pain. Skin: Negative for rash. Neurological: Negative for headaches, focal weakness or numbness. Psychiatric:  Depression.  ____________________________________________   PHYSICAL EXAM:  VITAL SIGNS: ED Triage Vitals  Enc Vitals Group     BP 09/26/20 0822 109/80     Pulse Rate 09/26/20 0822 62     Resp 09/26/20 0822 18     Temp 09/26/20  0822 97.8 F (36.6 C)     Temp Source 09/26/20 0822 Oral     SpO2 09/26/20 0822 100 %     Weight 09/26/20 0825 289 lb 14.5 oz (131.5 kg)     Height 09/26/20 0825 5\' 3"  (1.6 m)     Head Circumference --      Peak Flow --      Pain Score 09/26/20 0825 10     Pain Loc --      Pain Edu? --      Excl. in GC? --     Constitutional: Alert and oriented. Well appearing and in no acute distress. Mouth/Throat: Mucous membranes are moist.  Oropharynx  non-erythematous.  Multiple caries and devitalized teeth. Neck: No stridor.  Hematological/Lymphatic/Immunilogical: No cervical lymphadenopathy. Cardiovascular: Normal rate, regular rhythm. Grossly normal heart sounds.  Good peripheral circulation. Respiratory: Normal respiratory effort.  No retractions. Lungs CTAB. Neurologic:  Normal speech and language. No gross focal neurologic deficits are appreciated. No gait instability. Skin:  Skin is warm, dry and intact. No rash noted. Psychiatric: Mood and affect are normal. Speech and behavior are normal.  ____________________________________________   LABS (all labs ordered are listed, but only abnormal results are displayed)  Labs Reviewed - No data to display ____________________________________________  EKG   ____________________________________________  RADIOLOGY I, 09/28/20, personally viewed and evaluated these images (plain radiographs) as part of my medical decision making, as well as reviewing the written report by the radiologist.  ED MD interpretation:    Official radiology report(s): No results found.  ____________________________________________   PROCEDURES  Procedure(s) performed (including Critical Care):  Procedures   ____________________________________________   INITIAL IMPRESSION / ASSESSMENT AND PLAN / ED COURSE  As part of my medical decision making, I reviewed the following data within the electronic MEDICAL RECORD NUMBER         Patient presents with dental pain.  Physical exam shows multiple caries and devitalized dentures.  No abscess.  Patient given discharge care instruction advised to follow-up from list of dental, clinic given in her discharge care instructions.      ____________________________________________   FINAL CLINICAL IMPRESSION(S) / ED DIAGNOSES  Final diagnoses:  Pain due to dental caries     ED Discharge Orders         Ordered    amoxicillin (AMOXIL) 500 MG  capsule  3 times daily        09/26/20 0919    ibuprofen (ADVIL) 600 MG tablet  Every 8 hours PRN        09/26/20 0919    oxyCODONE-acetaminophen (PERCOCET) 7.5-325 MG tablet  Every 6 hours PRN        09/26/20 0919          *Please note:  09/28/20 was evaluated in Emergency Department on 09/26/2020 for the symptoms described in the history of present illness. She was evaluated in the context of the global COVID-19 pandemic, which necessitated consideration that the patient might be at risk for infection with the SARS-CoV-2 virus that causes COVID-19. Institutional protocols and algorithms that pertain to the evaluation of patients at risk for COVID-19 are in a state of rapid change based on information released by regulatory bodies including the CDC and federal and state organizations. These policies and algorithms were followed during the patient's care in the ED.  Some ED evaluations and interventions may be delayed as a result of limited staffing during and the pandemic.*   Note:  This  document was prepared using Conservation officer, historic buildings and may include unintentional dictation errors.    Joni Reining, PA-C 09/26/20 9163    Merwyn Katos, MD 09/26/20 [phone]

## 2020-09-26 NOTE — Discharge Instructions (Signed)
Follow discharge care instruction.  You are also given a list of dental clinics for follow-up care.  OPTIONS FOR DENTAL FOLLOW UP CARE  [place] Department of Health and Human Services - Local Safety Net Dental Clinics [email or web address]   [facility] [phone])  Sharl Ma [phone])  Brenda [phone])  [facility] [phone])  [facility] [phone]) This clinic caters to the indigent population and is on a lottery system. Location: [employer] [facility], [employer], [address], [place] Clinic Hours: Wednesdays from 6pm - 9pm, patients seen by a lottery system. For dates, call or go to [email or web address] Services: Cleanings, fillings and simple extractions. Payment Options: DENTAL WORK IS FREE OF CHARGE. Bring proof of income or support. Best way to get seen: Arrive at 5:15 pm - this is a lottery, NOT first come/first serve, so arriving earlier will not increase your chances of being seen.     [facility] [phone] Select option 1 for emergencies   Location: [facility], [place], [address], [place] Clinic Hours: No walk-ins accepted - call the day before to schedule an appointment. Check in times are 9:30 am and 1:30 pm. Services: Simple extractions, temporary fillings, pulpectomy/pulp debridement, uncomplicated abscess drainage. Payment Options: PAYMENT IS DUE AT THE TIME OF SERVICE.  Fee is usually $100-200, additional surgical procedures (e.g. abscess drainage) may be extra. Cash, checks, Visa/MasterCard accepted.  Can file Medicaid if patient is covered for dental - patient should call case worker to check. No discount for [facility] patients. Best way to get seen: MUST call the day before and get onto the schedule. Can usually be seen  the next 1-2 days. No walk-ins accepted.     [facility] [phone]   Location: [facility], [address], [place] Clinic Hours: M, W, Th, F 8am or 1:30pm, Tues 9a or 1:30 - first come/first served. Services: Simple extractions, temporary fillings, uncomplicated abscess drainage.  You do not need to be an [facility] resident. Payment Options: PAYMENT IS DUE AT THE TIME OF SERVICE. Dental insurance, otherwise sliding scale - bring proof of income or support. Depending on income and treatment needed, cost is usually $50-200. Best way to get seen: Arrive early as it is first come/first served.     [facility] [phone]   Location: [address] Clinic Hours: Mon-Thu 8a-5p Services: Most basic dental services including extractions and fillings. Payment Options: PAYMENT IS DUE AT THE TIME OF SERVICE. Sliding scale, up to 50% off - bring proof if income or support. Medicaid with dental option accepted. Best way to get seen: Call to schedule an appointment, can usually be seen within 2 weeks OR they will try to see walk-ins - show up at 8a or 2p (you may have to wait).     [facility] [phone] [place] RESIDENTS ONLY   Location: [facility], [address] [place], [place] [address] Clinic Hours: By appointment only. Monday - Thursday 8am-5pm, Friday 8am-12pm Services: Cleanings, fillings, extractions. Payment Options: PAYMENT IS DUE AT THE TIME OF SERVICE. Cash, Visa or MasterCard. Sliding scale - $30 minimum per service. Best way to get seen: Come in to office, complete packet and make an appointment - need proof of income or support monies for each household member and proof of [facility] residence. Usually takes about a month to  get in.     [facility] [phone]   Location: [address]  [facility] Clinic Hours: Walk-in Urgent Care Dental Services are offered Monday-Friday mornings only. The numbers of emergencies accepted daily is limited to the number of providers available. Maximum 15 - Mondays, Wednesdays & Thursdays Maximum 10 - Tuesdays & Fridays Services: You do not need to be a [facility] resident to be seen for a dental emergency. Emergencies are defined as pain, swelling, abnormal bleeding, or dental trauma. Walkins will receive x-rays if needed. NOTE: Dental cleaning is not an emergency. Payment Options: PAYMENT IS DUE AT THE TIME OF SERVICE. Minimum co-pay is $40.00 for uninsured patients. Minimum co-pay is $3.00 for Medicaid with dental coverage. Dental Insurance is accepted and must be presented at time of visit. Medicare does not cover dental. Forms of payment: Cash, credit card, checks. Best way to get seen: If not previously registered with the clinic, walk-in dental registration begins at 7:15 am and is on a first come/first serve basis. If previously registered with the clinic, call to make an appointment.     [facility] [phone] [place] RESIDENTS ONLY   Location: [address], [place], [place] Clinic Hours: Mon-Thu 10a-2p Services: Extractions only! Payment Options: FREE (donations accepted) - bring proof of income or support Best way to get seen: Call and schedule an appointment OR come at 8am on the 1st Monday of every month (except for holidays) when it is first come/first served.     [facility] [phone]   Location: [address] [place], [place] Clinic Hours: Friday mornings Services, Payment Options, Best way to get seen: Call for info

## 2020-09-26 NOTE — ED Notes (Signed)
Provided discharge instructions. Verbalized understanding.  

## 2020-09-26 NOTE — ED Notes (Signed)
Awaiting pharmacy to send lidocaine viscous to [facility], there is none in stock. Pending Discharge.

## 2020-10-18 ENCOUNTER — Emergency Department (HOSPITAL_COMMUNITY): Payer: Medicaid Other

## 2020-10-18 ENCOUNTER — Encounter (HOSPITAL_COMMUNITY): Payer: Self-pay

## 2020-10-18 ENCOUNTER — Other Ambulatory Visit: Payer: Self-pay

## 2020-10-18 ENCOUNTER — Emergency Department (HOSPITAL_COMMUNITY)
Admission: EM | Admit: 2020-10-18 | Discharge: 2020-10-18 | Disposition: A | Discharge status: Home / Self Care | Payer: Medicaid Other | Attending: Emergency Medicine | Admitting: Emergency Medicine

## 2020-10-18 DIAGNOSIS — K5732 Diverticulitis of large intestine without perforation or abscess without bleeding: Secondary | ICD-10-CM | POA: Diagnosis not present

## 2020-10-18 DIAGNOSIS — K5792 Diverticulitis of intestine, part unspecified, without perforation or abscess without bleeding: Secondary | ICD-10-CM

## 2020-10-18 DIAGNOSIS — R52 Pain, unspecified: Secondary | ICD-10-CM

## 2020-10-18 DIAGNOSIS — R109 Unspecified abdominal pain: Secondary | ICD-10-CM | POA: Diagnosis present

## 2020-10-18 LAB — BASIC METABOLIC PANEL
Anion gap: 7 (ref 5–15)
BUN: 9 mg/dL (ref 6–20)
CO2: 24 mmol/L (ref 22–32)
Calcium: 8.9 mg/dL (ref 8.9–10.3)
Chloride: 107 mmol/L (ref 98–111)
Creatinine, Ser: 0.87 mg/dL (ref 0.44–1.00)
GFR, Estimated: >60 mL/min (ref >60)
Glucose, Bld: 98 mg/dL (ref 70–99)
Potassium: 3.8 mmol/L (ref 3.5–5.1)
Sodium: 138 mmol/L (ref 135–145)

## 2020-10-18 LAB — I-STAT BETA HCG BLOOD, ED (MC, WL, AP ONLY): I-stat hCG, quantitative: <5 m[IU]/mL (ref <5)

## 2020-10-18 LAB — LIPASE, BLOOD: Lipase: 17 U/L (ref 11–51)

## 2020-10-18 LAB — URINALYSIS, ROUTINE W REFLEX MICROSCOPIC
Bilirubin Urine: NEGATIVE
Glucose, UA: NEGATIVE mg/dL
Hgb urine dipstick: NEGATIVE
Ketones, ur: NEGATIVE mg/dL
Leukocytes,Ua: NEGATIVE
Nitrite: POSITIVE — AB
Protein, ur: NEGATIVE mg/dL
Specific Gravity, Urine: 1.035 — ABNORMAL HIGH (ref 1.005–1.030)
pH: 5 (ref 5.0–8.0)

## 2020-10-18 LAB — HEPATIC FUNCTION PANEL
ALT: 37 U/L (ref 0–44)
AST: 47 U/L — ABNORMAL HIGH (ref 15–41)
Albumin: 3.7 g/dL (ref 3.5–5.0)
Alkaline Phosphatase: 73 U/L (ref 38–126)
Bilirubin, Direct: 0.1 mg/dL (ref 0.0–0.2)
Indirect Bilirubin: 0.6 mg/dL (ref 0.3–0.9)
Total Bilirubin: 0.7 mg/dL (ref 0.3–1.2)
Total Protein: 7.6 g/dL (ref 6.5–8.1)

## 2020-10-18 LAB — CBC
HCT: 38.2 % (ref 36.0–46.0)
Hemoglobin: 12 g/dL (ref 12.0–15.0)
MCH: 27.7 pg (ref 26.0–34.0)
MCHC: 31.4 g/dL (ref 30.0–36.0)
MCV: 88.2 fL (ref 80.0–100.0)
Platelets: 227 10*3/uL (ref 150–400)
RBC: 4.33 MIL/uL (ref 3.87–5.11)
RDW: 12.9 % (ref 11.5–15.5)
WBC: 7.7 10*3/uL (ref 4.0–10.5)
nRBC: 0 % (ref 0.0–0.2)

## 2020-10-18 MED ORDER — ONDANSETRON HCL 4 MG/2ML IJ SOLN
4.0000 mg | Freq: Once | INTRAMUSCULAR | Status: AC
  Administered 2020-10-18: 4 mg via INTRAVENOUS
  Filled 2020-10-18: qty 2

## 2020-10-18 MED ORDER — CIPROFLOXACIN HCL 500 MG PO TABS: 500.0000 mg | ORAL_TABLET | Freq: Two times a day (BID) | ORAL | 0 refills | Status: DC

## 2020-10-18 MED ORDER — PANTOPRAZOLE SODIUM 40 MG IV SOLR
40.0000 mg | Freq: Once | INTRAVENOUS | Status: AC
  Administered 2020-10-18: 40 mg via INTRAVENOUS
  Filled 2020-10-18: qty 40

## 2020-10-18 MED ORDER — HYDROMORPHONE HCL 1 MG/ML IJ SOLN
0.5000 mg | Freq: Once | INTRAMUSCULAR | Status: AC
  Administered 2020-10-18: 0.5 mg via INTRAVENOUS
  Filled 2020-10-18: qty 1

## 2020-10-18 MED ORDER — METOCLOPRAMIDE HCL 5 MG/ML IJ SOLN
10.0000 mg | Freq: Once | INTRAMUSCULAR | Status: AC
  Administered 2020-10-18: 10 mg via INTRAVENOUS
  Filled 2020-10-18: qty 2

## 2020-10-18 MED ORDER — SODIUM CHLORIDE 0.9 % IV BOLUS
1000.0000 mL | Freq: Once | INTRAVENOUS | Status: AC
  Administered 2020-10-18: 1000 mL via INTRAVENOUS

## 2020-10-18 MED ORDER — HYDROCODONE-ACETAMINOPHEN 5-325 MG PO TABS: 1.0000 | ORAL_TABLET | Freq: Four times a day (QID) | ORAL | 0 refills | Status: DC | PRN

## 2020-10-18 MED ORDER — ONDANSETRON 4 MG PO TBDP: ORAL_TABLET | ORAL | 0 refills | Status: DC

## 2020-10-18 MED ORDER — IOHEXOL 300 MG/ML  SOLN
100.0000 mL | Freq: Once | INTRAMUSCULAR | Status: AC | PRN
  Administered 2020-10-18: 100 mL via INTRAVENOUS

## 2020-10-18 MED ORDER — METRONIDAZOLE IN NACL 5-0.79 MG/ML-% IV SOLN
500.0000 mg | Freq: Once | INTRAVENOUS | Status: AC
  Administered 2020-10-18: 500 mg via INTRAVENOUS
  Filled 2020-10-18: qty 100

## 2020-10-18 MED ORDER — CIPROFLOXACIN IN D5W 400 MG/200ML IV SOLN
400.0000 mg | Freq: Once | INTRAVENOUS | Status: AC
  Administered 2020-10-18: 400 mg via INTRAVENOUS
  Filled 2020-10-18: qty 200

## 2020-10-18 MED ORDER — METRONIDAZOLE 500 MG PO TABS: 500.0000 mg | ORAL_TABLET | Freq: Three times a day (TID) | ORAL | 0 refills | Status: DC

## 2020-10-18 NOTE — ED Notes (Signed)
Pt transported to [facility]

## 2020-10-18 NOTE — ED Provider Notes (Signed)
[facility]-EMERGENCY DEPT Provider Note   CSN: [id number] Arrival date & time: 10/18/20  0938     History Chief Complaint  Patient presents with  . Flank Pain    Cathy Joseph is a 30 y.o. female.  Patient complains of right flank pain and abdominal pain for a few days.  No vomiting no fever no diarrhea  The history is provided by the patient and medical records.  Flank Pain This is a new problem. The current episode started more than 2 days ago. The problem occurs constantly. The problem has not changed since onset.Associated symptoms include abdominal pain. Pertinent negatives include no chest pain and no headaches. Nothing aggravates the symptoms.       Past Medical History:  Diagnosis Date  . Depression     Patient Active Problem List   Diagnosis Date Noted  . Vaginal bleeding affecting early pregnancy 09/14/2019  . Preterm delivery 09/14/2019    Past Surgical History:  Procedure Laterality Date  . DILATION AND CURETTAGE OF UTERUS N/A 09/14/2019   Procedure: DILATATION AND CURETTAGE;  Surgeon: Christeen Douglas, MD;  Location: [facility] ORS;  Service: Gynecology;  Laterality: N/A;     OB History    Gravida  3   Para  3   Term  0   Preterm  3   AB      Living  2     SAB      IAB      Ectopic      Multiple  0   Live Births  2           Family History  Problem Relation Age of Onset  . Diabetes Paternal Grandmother   . Hypertension Paternal Grandmother     Social History   Tobacco Use  . Smoking status: Never Smoker  . Smokeless tobacco: Never Used  Substance Use Topics  . Alcohol use: No  . Drug use: No    Home Medications Prior to Admission medications   Medication Sig Start Date End Date Taking? Authorizing Provider  ciprofloxacin (CIPRO) 500 MG tablet Take 1 tablet (500 mg total) by mouth 2 (two) times daily. 10/18/20  Yes Bethann Berkshire, MD  HYDROcodone-acetaminophen (NORCO/VICODIN) 5-325 MG tablet Take 1  tablet by mouth every 6 (six) hours as needed for moderate pain. 10/18/20  Yes Bethann Berkshire, MD  levonorgestrel (LILETTA) 19.5 MCG/DAY IUD IUD 1 each by Intrauterine route once. Placed in beginning of 2021 12/14/19  Yes [provider]  metroNIDAZOLE (FLAGYL) 500 MG tablet Take 1 tablet (500 mg total) by mouth 3 (three) times daily. One po bid x 7 days 10/18/20  Yes Bethann Berkshire, MD  ondansetron (ZOFRAN ODT) 4 MG disintegrating tablet 4mg  ODT q4 hours prn nausea/vomit 10/18/20  Yes 12/16/20, MD  amoxicillin (AMOXIL) 500 MG capsule Take 1 capsule (500 mg total) by mouth 3 (three) times daily. Patient not taking: Reported on 10/18/2020 09/26/20   09/28/20, PA-C  ibuprofen (ADVIL) 600 MG tablet Take 1 tablet (600 mg total) by mouth every 8 (eight) hours as needed. Patient not taking: Reported on 10/18/2020 09/12/20   14/7/21, PA-C  ibuprofen (ADVIL) 600 MG tablet Take 1 tablet (600 mg total) by mouth every 8 (eight) hours as needed. Patient not taking: Reported on 10/18/2020 09/26/20   09/28/20, PA-C  oxyCODONE-acetaminophen (PERCOCET) 7.5-325 MG tablet Take 1 tablet by mouth every 6 (six) hours as needed. Patient not taking: Reported on  10/18/2020 09/26/20   Joni Reining, PA-C  sertraline (ZOLOFT) 50 MG tablet Take 1 tablet (50 mg total) by mouth daily. Patient not taking: Reported on 10/18/2020 09/15/19   Ward, Elenora Fender, MD  promethazine (PHENERGAN) 25 MG tablet Take 1 tablet (25 mg total) by mouth every 6 (six) hours as needed for nausea or vomiting. 06/15/18 09/12/20  Minna Antis, MD    Allergies    Patient has no known allergies.  Review of Systems   Review of Systems  Constitutional: Negative for appetite change and fatigue.  HENT: Negative for congestion, ear discharge and sinus pressure.   Eyes: Negative for discharge.  Respiratory: Negative for cough.   Cardiovascular: Negative for chest pain.  Gastrointestinal: Positive for abdominal pain.  Negative for diarrhea.  Genitourinary: Positive for flank pain. Negative for frequency and hematuria.  Musculoskeletal: Negative for back pain.  Skin: Negative for rash.  Neurological: Negative for seizures and headaches.  Psychiatric/Behavioral: Negative for hallucinations.    Physical Exam Updated Vital Signs BP 101/70   Pulse 69   Temp 98.4 F (36.9 C) (Oral)   Resp 13   SpO2 100%   Physical Exam Vitals and nursing note reviewed.  Constitutional:      Appearance: She is well-developed.  HENT:     Head: Normocephalic.     Nose: Nose normal.  Eyes:     General: No scleral icterus.    Extraocular Movements: EOM normal.     Conjunctiva/sclera: Conjunctivae normal.  Neck:     Thyroid: No thyromegaly.  Cardiovascular:     Rate and Rhythm: Normal rate and regular rhythm.     Heart sounds: No murmur heard. No friction rub. No gallop.   Pulmonary:     Breath sounds: No stridor. No wheezing or rales.  Chest:     Chest wall: No tenderness.  Abdominal:     General: There is no distension.     Tenderness: There is abdominal tenderness. There is no rebound.     Comments: Mild tenderness throughout  Musculoskeletal:        General: No edema. Normal range of motion.     Cervical back: Neck supple.  Lymphadenopathy:     Cervical: No cervical adenopathy.  Skin:    Findings: No erythema or rash.  Neurological:     Mental Status: She is alert and oriented to person, place, and time.     Motor: No abnormal muscle tone.     Coordination: Coordination normal.  Psychiatric:        Mood and Affect: Mood and affect normal.        Behavior: Behavior normal.     ED Results / Procedures / Treatments   Labs (all labs ordered are listed, but only abnormal results are displayed) Labs Reviewed  URINALYSIS, ROUTINE W REFLEX MICROSCOPIC - Abnormal; Notable for the following components:      Result Value   APPearance HAZY (*)    Specific Gravity, Urine 1.035 (*)    Nitrite POSITIVE  (*)    Bacteria, UA RARE (*)    All other components within normal limits  HEPATIC FUNCTION PANEL - Abnormal; Notable for the following components:   AST 47 (*)    All other components within normal limits  CBC  BASIC METABOLIC PANEL  LIPASE, BLOOD  I-STAT BETA HCG BLOOD, ED ([facility], [facility], [facility] ONLY)    EKG None  Radiology CT ABDOMEN PELVIS W CONTRAST  Result Date: 10/18/2020 CLINICAL DATA:  Right-sided  flank and lower quadrant pain for 1 week. Nausea. EXAM: CT ABDOMEN AND PELVIS WITH CONTRAST TECHNIQUE: Multidetector CT imaging of the abdomen and pelvis was performed using the standard protocol following bolus administration of intravenous contrast. CONTRAST:  OMNIPAQUE IOHEXOL 300 MG/ML  SOLN COMPARISON:  10/18/2020 ultrasound.  CT 11/12/2016. FINDINGS: Lower chest: Clear lung bases. Normal heart size without pericardial or pleural effusion. Hepatobiliary: Normal liver. Normal gallbladder, without biliary ductal dilatation. Pancreas: Normal, without mass or ductal dilatation. Spleen: Normal in size, without focal abnormality. Adrenals/Urinary Tract: Normal adrenal glands. Normal kidneys, without hydronephrosis. Normal urinary bladder. Stomach/Bowel: Normal stomach, without wall thickening. Extensive colonic diverticulosis. Right abdominal edema is centered posterior to the ascending colon, about a diverticulum including on 42/2. Normal terminal ileum. The appendix originates on 53/2 and is normal. The appendiceal tip is positioned just inferior and lateral to the inflammation, but favored to be separate on 44/2. Normal small bowel. Vascular/Lymphatic: Normal caliber of the aorta and branch vessels. No abdominopelvic adenopathy. Reproductive: Intrauterine device, appropriately positioned. Right ovarian peripherally enhancing 1.3 cm lesion on 68/2. Other: Trace free pelvic fluid is likely physiologic. No free colonic perforation or drainable abscess. Musculoskeletal: No acute osseous abnormality.  IMPRESSION: 1. Non complicated ascending colonic diverticulitis. 2. Right ovarian corpus luteal cyst. Electronically Signed   By: Jeronimo Greaves M.D.   On: 10/18/2020 14:03   US Abdomen Limited RUQ (LIVER/GB)  Result Date: 10/18/2020 CLINICAL DATA:  Abdominal pain. EXAM: ULTRASOUND ABDOMEN LIMITED RIGHT UPPER QUADRANT COMPARISON:  November 12, 2016.  November 04, 2014. FINDINGS: Gallbladder: No gallstones or wall thickening visualized. No sonographic Murphy sign noted by sonographer. Common bile duct: Diameter: 5 mm which is within normal limits. Liver: No focal lesion identified. Within normal limits in parenchymal echogenicity. Portal vein is patent on color Doppler imaging with normal direction of blood flow towards the liver. Other: None. IMPRESSION: No abnormality seen in the right upper quadrant of the abdomen. Electronically Signed   By: Lupita Raider M.D.   On: 10/18/2020 10:21    Procedures Procedures (including critical care time)  Medications Ordered in ED Medications  ciprofloxacin (CIPRO) IVPB 400 mg (has no administration in time range)  metroNIDAZOLE (FLAGYL) IVPB 500 mg (has no administration in time range)  metoCLOPramide (REGLAN) injection 10 mg (has no administration in time range)  pantoprazole (PROTONIX) injection 40 mg (40 mg Intravenous Given 10/18/20 1027)  HYDROmorphone (DILAUDID) injection 0.5 mg (0.5 mg Intravenous Given 10/18/20 1027)  ondansetron (ZOFRAN) injection 4 mg (4 mg Intravenous Given 10/18/20 1027)  iohexol (OMNIPAQUE) 300 MG/ML solution 100 mL (100 mLs Intravenous Contrast Given 10/18/20 1317)  HYDROmorphone (DILAUDID) injection 0.5 mg (0.5 mg Intravenous Given 10/18/20 1427)  ondansetron (ZOFRAN) injection 4 mg (4 mg Intravenous Given 10/18/20 1428)  sodium chloride 0.9 % bolus 1,000 mL (1,000 mLs Intravenous New Bag/Given 10/18/20 1606)    ED Course  I have reviewed the triage vital signs and the nursing notes.  Pertinent labs & imaging results that were  available during my care of the patient were reviewed by me and considered in my medical decision making (see chart for details).    MDM Rules/Calculators/A&P                          Patient nontoxic with normal white count.  CT scan shows noncomplicated diverticulitis.  She will be treated with Cipro Flagyl along with Zofran and Vicodin and follow-up with GI or her family  doctor Final Clinical Impression(s) / ED Diagnoses Final diagnoses:  Pain  Diverticulitis    Rx / DC Orders ED Discharge Orders         Ordered    ciprofloxacin (CIPRO) 500 MG tablet  2 times daily        10/18/20 1607    metroNIDAZOLE (FLAGYL) 500 MG tablet  3 times daily        10/18/20 1607    ondansetron (ZOFRAN ODT) 4 MG disintegrating tablet        10/18/20 1607    HYDROcodone-acetaminophen (NORCO/VICODIN) 5-325 MG tablet  Every 6 hours PRN        10/18/20 1607           Bethann BerkshireZammit, Jamon Hayhurst, MD 10/18/20 1612

## 2020-10-18 NOTE — ED Triage Notes (Signed)
Pt arrived via walk in, c/o right sided flank pain, radiating to RLQ x1 week. Endorses some nausea. Denies any urinary sx at this time.

## 2020-10-18 NOTE — ED Notes (Signed)
Lab notified to add-on lipase and hepatic function panel to previously collected blood work.  

## 2020-10-18 NOTE — Discharge Instructions (Signed)
Follow-up with your family doctor or [facility] gastroenterology next week.  Return if fever vomiting or severe pain

## 2020-10-18 NOTE — ED Notes (Signed)
Discharge paperwork reviewed with pt, including prescriptions.  Pt verbalized understanding, ambulatory to ED exit to meet ride.

## 2021-03-07 ENCOUNTER — Emergency Department
Admission: EM | Admit: 2021-03-07 | Discharge: 2021-03-07 | Disposition: A | Discharge status: Home / Self Care | Payer: Medicaid Other | Attending: Emergency Medicine | Admitting: Emergency Medicine

## 2021-03-07 ENCOUNTER — Encounter: Payer: Self-pay | Admitting: Emergency Medicine

## 2021-03-07 ENCOUNTER — Other Ambulatory Visit: Payer: Self-pay

## 2021-03-07 ENCOUNTER — Emergency Department: Payer: Medicaid Other

## 2021-03-07 DIAGNOSIS — R1030 Lower abdominal pain, unspecified: Secondary | ICD-10-CM | POA: Diagnosis present

## 2021-03-07 DIAGNOSIS — B9689 Other specified bacterial agents as the cause of diseases classified elsewhere: Secondary | ICD-10-CM | POA: Diagnosis not present

## 2021-03-07 DIAGNOSIS — N3000 Acute cystitis without hematuria: Secondary | ICD-10-CM | POA: Diagnosis not present

## 2021-03-07 LAB — COMPREHENSIVE METABOLIC PANEL
ALT: 13 U/L (ref 0–44)
AST: 19 U/L (ref 15–41)
Albumin: 3.7 g/dL (ref 3.5–5.0)
Alkaline Phosphatase: 50 U/L (ref 38–126)
Anion gap: 6 (ref 5–15)
BUN: 15 mg/dL (ref 6–20)
CO2: 24 mmol/L (ref 22–32)
Calcium: 8.8 mg/dL — ABNORMAL LOW (ref 8.9–10.3)
Chloride: 106 mmol/L (ref 98–111)
Creatinine, Ser: 0.82 mg/dL (ref 0.44–1.00)
GFR, Estimated: >60 mL/min (ref >60)
Glucose, Bld: 93 mg/dL (ref 70–99)
Potassium: 4.2 mmol/L (ref 3.5–5.1)
Sodium: 136 mmol/L (ref 135–145)
Total Bilirubin: 0.9 mg/dL (ref 0.3–1.2)
Total Protein: 7.1 g/dL (ref 6.5–8.1)

## 2021-03-07 LAB — URINALYSIS, COMPLETE (UACMP) WITH MICROSCOPIC
Bilirubin Urine: NEGATIVE
Glucose, UA: NEGATIVE mg/dL
Hgb urine dipstick: NEGATIVE
Ketones, ur: NEGATIVE mg/dL
Nitrite: NEGATIVE
Protein, ur: NEGATIVE mg/dL
Specific Gravity, Urine: 1.019 (ref 1.005–1.030)
WBC, UA: >50 WBC/hpf — ABNORMAL HIGH (ref 0–5)
pH: 7 (ref 5.0–8.0)

## 2021-03-07 LAB — CBC
HCT: 36.9 % (ref 36.0–46.0)
Hemoglobin: 12.1 g/dL (ref 12.0–15.0)
MCH: 28.1 pg (ref 26.0–34.0)
MCHC: 32.8 g/dL (ref 30.0–36.0)
MCV: 85.6 fL (ref 80.0–100.0)
Platelets: 221 10*3/uL (ref 150–400)
RBC: 4.31 MIL/uL (ref 3.87–5.11)
RDW: 13 % (ref 11.5–15.5)
WBC: 5.8 10*3/uL (ref 4.0–10.5)
nRBC: 0 % (ref 0.0–0.2)

## 2021-03-07 LAB — LIPASE, BLOOD: Lipase: 22 U/L (ref 11–51)

## 2021-03-07 LAB — PREGNANCY, URINE: Preg Test, Ur: NEGATIVE

## 2021-03-07 MED ORDER — HALOPERIDOL LACTATE 5 MG/ML IJ SOLN
2.5000 mg | Freq: Once | INTRAMUSCULAR | Status: AC
  Administered 2021-03-07: 2.5 mg via INTRAVENOUS

## 2021-03-07 MED ORDER — LACTATED RINGERS IV BOLUS
1000.0000 mL | Freq: Once | INTRAVENOUS | Status: AC
  Administered 2021-03-07: 1000 mL via INTRAVENOUS

## 2021-03-07 MED ORDER — ONDANSETRON HCL 4 MG/2ML IJ SOLN
4.0000 mg | Freq: Once | INTRAMUSCULAR | Status: AC
  Administered 2021-03-07: 4 mg via INTRAVENOUS
  Filled 2021-03-07: qty 2

## 2021-03-07 MED ORDER — IBUPROFEN 600 MG PO TABS: 600.0000 mg | ORAL_TABLET | Freq: Three times a day (TID) | ORAL | 0 refills | Status: DC | PRN

## 2021-03-07 MED ORDER — CEPHALEXIN 500 MG PO CAPS: 500.0000 mg | ORAL_CAPSULE | Freq: Three times a day (TID) | ORAL | 0 refills | Status: AC

## 2021-03-07 MED ORDER — PROMETHAZINE HCL 25 MG/ML IJ SOLN
25.0000 mg | Freq: Once | INTRAMUSCULAR | Status: DC
  Administered 2021-03-07: 25 mg via INTRAMUSCULAR
  Filled 2021-03-07: qty 1

## 2021-03-07 MED ORDER — ONDANSETRON 4 MG PO TBDP: 4.0000 mg | ORAL_TABLET | Freq: Three times a day (TID) | ORAL | 0 refills | Status: DC | PRN

## 2021-03-07 MED ORDER — ONDANSETRON 4 MG PO TBDP
4.0000 mg | ORAL_TABLET | Freq: Once | ORAL | Status: AC
  Administered 2021-03-07: 4 mg via ORAL
  Filled 2021-03-07: qty 1

## 2021-03-07 MED ORDER — HYDROMORPHONE HCL 1 MG/ML IJ SOLN
1.0000 mg | Freq: Once | INTRAMUSCULAR | Status: AC
  Administered 2021-03-07: 1 mg via INTRAVENOUS
  Filled 2021-03-07: qty 1

## 2021-03-07 MED ORDER — SODIUM CHLORIDE 0.9 % IV SOLN
2.0000 g | Freq: Once | INTRAVENOUS | Status: AC
  Administered 2021-03-07: 2 g via INTRAVENOUS
  Filled 2021-03-07: qty 20

## 2021-03-07 NOTE — ED Notes (Signed)
Pt is still N/V at the moment

## 2021-03-07 NOTE — ED Notes (Signed)
Pt N/V was witnessed in lobby by [employer]; pt back in [facility] discharge undo by [employer];

## 2021-03-07 NOTE — ED Provider Notes (Addendum)
[facility] Emergency Department Provider Note  ____________________________________________   Event Date/Time   First MD Initiated Contact with Patient 03/07/21 1012     (approximate)  I have reviewed the triage vital signs and the nursing notes.   HISTORY  Chief Complaint Abdominal Pain    HPI WM FRUCHTER is a 30 y.o. female with past medical history of diverticulitis here with nausea, vomiting, diarrhea, and lower abdominal pain.  Patient states that for the last 2 days or so, she has had progressively worsening generalized but primarily lower abdominal pain along with vomiting.  She has had some loose stool.  She had some mild dysuria as well.  The pain radiates up towards her left lower quadrant and left flank.  She has a history of diverticulitis with similar symptoms.  She denies any known sick contacts.  No suspicious food intake.  No other recent changes in health.        Past Medical History:  Diagnosis Date  . Depression     Patient Active Problem List   Diagnosis Date Noted  . Vaginal bleeding affecting early pregnancy 09/14/2019  . Preterm delivery 09/14/2019    Past Surgical History:  Procedure Laterality Date  . DILATION AND CURETTAGE OF UTERUS N/A 09/14/2019   Procedure: DILATATION AND CURETTAGE;  Surgeon: Christeen Douglas, MD;  Location: [facility] ORS;  Service: Gynecology;  Laterality: N/A;    Prior to Admission medications   Medication Sig Start Date End Date Taking? Authorizing Provider  cephALEXin (KEFLEX) 500 MG capsule Take 1 capsule (500 mg total) by mouth 3 (three) times daily for 7 days. 03/07/21 03/14/21 Yes Shaune Pollack, MD  ibuprofen (ADVIL) 600 MG tablet Take 1 tablet (600 mg total) by mouth every 8 (eight) hours as needed for moderate pain or cramping. 03/07/21  Yes Shaune Pollack, MD  ondansetron (ZOFRAN ODT) 4 MG disintegrating tablet Take 1 tablet (4 mg total) by mouth every 8 (eight) hours as needed for nausea or  vomiting. 03/07/21  Yes Shaune Pollack, MD  amoxicillin (AMOXIL) 500 MG capsule Take 1 capsule (500 mg total) by mouth 3 (three) times daily. Patient not taking: Reported on 10/18/2020 09/26/20   Joni Reining, PA-C  ciprofloxacin (CIPRO) 500 MG tablet Take 1 tablet (500 mg total) by mouth 2 (two) times daily. 10/18/20   Bethann Berkshire, MD  HYDROcodone-acetaminophen (NORCO/VICODIN) 5-325 MG tablet Take 1 tablet by mouth every 6 (six) hours as needed for moderate pain. 10/18/20   Bethann Berkshire, MD  levonorgestrel (LILETTA) 19.5 MCG/DAY IUD IUD 1 each by Intrauterine route once. Placed in beginning of 2021 12/14/19   [provider]  metroNIDAZOLE (FLAGYL) 500 MG tablet Take 1 tablet (500 mg total) by mouth 3 (three) times daily. One po bid x 7 days 10/18/20   Bethann Berkshire, MD  oxyCODONE-acetaminophen (PERCOCET) 7.5-325 MG tablet Take 1 tablet by mouth every 6 (six) hours as needed. Patient not taking: Reported on 10/18/2020 09/26/20   Joni Reining, PA-C  sertraline (ZOLOFT) 50 MG tablet Take 1 tablet (50 mg total) by mouth daily. Patient not taking: Reported on 10/18/2020 09/15/19   Ward, Elenora Fender, MD  promethazine (PHENERGAN) 25 MG tablet Take 1 tablet (25 mg total) by mouth every 6 (six) hours as needed for nausea or vomiting. 06/15/18 09/12/20  Minna Antis, MD    Allergies Patient has no known allergies.  Family History  Problem Relation Age of Onset  . Diabetes Paternal Grandmother   . Hypertension  Paternal Grandmother     Social History Social History   Tobacco Use  . Smoking status: Never Smoker  . Smokeless tobacco: Never Used  Substance Use Topics  . Alcohol use: No  . Drug use: No    Review of Systems  Review of Systems  Constitutional: Positive for fatigue. Negative for fever.  HENT: Negative for congestion and sore throat.   Eyes: Negative for visual disturbance.  Respiratory: Negative for cough and shortness of breath.   Cardiovascular: Negative for  chest pain.  Gastrointestinal: Positive for abdominal pain, diarrhea, nausea and vomiting.  Genitourinary: Negative for flank pain.  Musculoskeletal: Negative for back pain and neck pain.  Skin: Negative for rash and wound.  Neurological: Positive for weakness.  All other systems reviewed and are negative.    ____________________________________________  PHYSICAL EXAM:      VITAL SIGNS: ED Triage Vitals  Enc Vitals Group     BP 03/07/21 0803 130/84     Pulse Rate 03/07/21 0803 70     Resp 03/07/21 0803 18     Temp 03/07/21 0803 (!) 97.4 F (36.3 C)     Temp Source 03/07/21 0803 Oral     SpO2 03/07/21 0803 100 %     Weight 03/07/21 0805 290 lb (131.5 kg)     Height 03/07/21 0805 5\' 3"  (1.6 m)     Head Circumference --      Peak Flow --      Pain Score 03/07/21 0806 10     Pain Loc --      Pain Edu? --      Excl. in GC? --      Physical Exam Vitals and nursing note reviewed.  Constitutional:      General: She is not in acute distress.    Appearance: She is well-developed.  HENT:     Head: Normocephalic and atraumatic.  Eyes:     Conjunctiva/sclera: Conjunctivae normal.  Cardiovascular:     Rate and Rhythm: Normal rate and regular rhythm.     Heart sounds: Normal heart sounds. No murmur heard. No friction rub.  Pulmonary:     Effort: Pulmonary effort is normal. No respiratory distress.     Breath sounds: Normal breath sounds. No wheezing or rales.  Abdominal:     General: There is no distension.     Palpations: Abdomen is soft.     Tenderness: There is abdominal tenderness in the right lower quadrant, suprapubic area and left lower quadrant. There is no guarding or rebound. Negative signs include Murphy's sign.  Musculoskeletal:     Cervical back: Neck supple.  Skin:    General: Skin is warm.     Capillary Refill: Capillary refill takes less than 2 seconds.  Neurological:     Mental Status: She is alert and oriented to person, place, and time.     Motor: No  abnormal muscle tone.       ____________________________________________   LABS (all labs ordered are listed, but only abnormal results are displayed)  Labs Reviewed  COMPREHENSIVE METABOLIC PANEL - Abnormal; Notable for the following components:      Result Value   Calcium 8.8 (*)    All other components within normal limits  URINALYSIS, COMPLETE (UACMP) WITH MICROSCOPIC - Abnormal; Notable for the following components:   Color, Urine YELLOW (*)    APPearance HAZY (*)    Leukocytes,Ua SMALL (*)    WBC, UA >50 (*)    Bacteria, UA MANY (*)  All other components within normal limits  URINE CULTURE  LIPASE, BLOOD  CBC  PREGNANCY, URINE    ____________________________________________  EKG:  ________________________________________  RADIOLOGY All imaging, including plain films, CT scans, and ultrasounds, independently reviewed by me, and interpretations confirmed via formal radiology reads.  ED MD interpretation:   CT A/P: No acute findings, tiny L renal calculus  Official radiology report(s): CT ABDOMEN PELVIS WO CONTRAST  Result Date: 03/07/2021 CLINICAL DATA:  Left lower quadrant abdominal pain. History of diverticulitis EXAM: CT ABDOMEN AND PELVIS WITHOUT CONTRAST TECHNIQUE: Multidetector CT imaging of the abdomen and pelvis was performed following the standard protocol without IV contrast. COMPARISON:  10/18/2020 FINDINGS: Lower chest: Included lung bases are clear.  Heart size is normal. Hepatobiliary: Unremarkable unenhanced appearance of the liver. No focal liver lesion identified. Gallbladder within normal limits. No hyperdense gallstone. No biliary dilatation. Pancreas: Unremarkable. No pancreatic ductal dilatation or surrounding inflammatory changes. Spleen: Normal in size without focal abnormality. Adrenals/Urinary Tract: Adrenal glands are unremarkable. Tiny 1-2 mm punctate stone within the inferior pole of the left kidney (series 5, image 62). Kidneys have an  otherwise unremarkable unenhanced appearance. No hydronephrosis. Bladder is unremarkable. Stomach/Bowel: Small sliding type hiatal hernia. Stomach is otherwise within normal limits. Unremarkable small bowel. No dilated loops of bowel to suggest obstruction. Extensive diverticulosis throughout the colon. No focally inflamed diverticula is seen. No pericolonic inflammatory changes. Normal appendix is present in the right lower quadrant. Vascular/Lymphatic: No significant vascular findings are present. No enlarged abdominal or pelvic lymph nodes. Reproductive: IUD present within the uterus. No adnexal abnormality. Other: No free fluid. No abdominopelvic fluid collection. No pneumoperitoneum. No abdominal wall hernia. Musculoskeletal: No acute or significant osseous findings. IMPRESSION: 1. No acute abdominopelvic findings. 2. Tiny 1-2 mm nonobstructing left renal calculus. 3. Extensive colonic diverticulosis without evidence of acute diverticulitis. 4. Small sliding type hiatal hernia. Electronically Signed   By: Duanne Guess D.O.   On: 03/07/2021 10:54    ____________________________________________  PROCEDURES   Procedure(s) performed (including Critical Care):  Procedures  ____________________________________________  INITIAL IMPRESSION / MDM / ASSESSMENT AND PLAN / ED COURSE  As part of my medical decision making, I reviewed the following data within the electronic MEDICAL RECORD NUMBER Nursing notes reviewed and incorporated, Old chart reviewed, Notes from prior ED visits, and [place] Controlled Substance Database       *Cathy Joseph was evaluated in Emergency Department on 03/07/2021 for the symptoms described in the history of present illness. She was evaluated in the context of the global COVID-19 pandemic, which necessitated consideration that the patient might be at risk for infection with the SARS-CoV-2 virus that causes COVID-19. Institutional protocols and algorithms that pertain to the  evaluation of patients at risk for COVID-19 are in a state of rapid change based on information released by regulatory bodies including the CDC and federal and state organizations. These policies and algorithms were followed during the patient's care in the ED.  Some ED evaluations and interventions may be delayed as a result of limited staffing during the pandemic.*     Medical Decision Making: 30 year old female here with lower abdominal pain, nausea, vomiting, and diarrhea.  Suspect viral GI illness with possible concomitant UTI.  Patient is afebrile and hemodynamically stable.  No leukocytosis.  CMP is unremarkable.  Lipase normal.  Urinalysis shows pyuria with many bacteria, negative nitrites.  UPT negative.  CT abdomen and pelvis obtained given her history of diverticulitis.  This was reviewed by me,  shows no acute findings.  She has a small nonobstructing left renal calculus.  Given her positive UA, will treat for UTI though clinically her diarrhea is more consistent with viral GI illness.  Will treat supportively otherwise, encourage fluids, discharged with outpatient follow-up.  ADDENDUM: Pt had moderate nausea after discharge, with emesis. She had not vomited in ED. Pt given additional antiemetics and fluids, feels much better. She is nontoxic, alert, and in NAD. Tolerating PO. D/c with ongoing outpt care. ____________________________________________  FINAL CLINICAL IMPRESSION(S) / ED DIAGNOSES  Final diagnoses:  Lower abdominal pain  Acute cystitis without hematuria     MEDICATIONS GIVEN DURING THIS VISIT:  Medications  HYDROmorphone (DILAUDID) injection 1 mg (1 mg Intravenous Given 03/07/21 1053)  ondansetron (ZOFRAN) injection 4 mg (4 mg Intravenous Given 03/07/21 1052)  lactated ringers bolus 1,000 mL (0 mLs Intravenous Stopping Infusion hung by another clincian 03/07/21 1213)  cefTRIAXone (ROCEPHIN) 2 g in sodium chloride 0.9 % 100 mL IVPB (0 g Intravenous Stopped 03/07/21 1206)   ondansetron (ZOFRAN-ODT) disintegrating tablet 4 mg (4 mg Oral Given 03/07/21 1237)  lactated ringers bolus 1,000 mL (0 mLs Intravenous Stopped 03/07/21 1547)  haloperidol lactate (HALDOL) injection 2.5 mg (2.5 mg Intravenous Given 03/07/21 1413)     ED Discharge Orders         Ordered    cephALEXin (KEFLEX) 500 MG capsule  3 times daily        03/07/21 1143    ondansetron (ZOFRAN ODT) 4 MG disintegrating tablet  Every 8 hours PRN        03/07/21 1143    ibuprofen (ADVIL) 600 MG tablet  Every 8 hours PRN        03/07/21 1143           Note:  This document was prepared using Dragon voice recognition software and may include unintentional dictation errors.   Shaune PollackIsaacs, Oksana Deberry, MD 03/07/21 1144    Shaune PollackIsaacs, Daimion Adamcik, MD 03/07/21 [phone]

## 2021-03-07 NOTE — ED Triage Notes (Signed)
Pt arrives to the ED with complaint of ABD pain radiating to her left side, and nausea. States that yesterday she continuously had bowel movements without relief. Pt has a history of diverticulitis. Pt ambulatory to room, with unlabored respirations.

## 2021-03-07 NOTE — ED Notes (Signed)
Pt has hx of diverticulitis, states that it could be a flare up; when palpated pt stated pain was at groin and LLQ radiating to ULQ.

## 2021-03-10 LAB — URINE CULTURE: Culture: >=100000 — AB

## 2021-05-11 ENCOUNTER — Encounter (HOSPITAL_COMMUNITY): Payer: Self-pay

## 2021-05-11 ENCOUNTER — Other Ambulatory Visit: Payer: Self-pay

## 2021-05-11 ENCOUNTER — Emergency Department (HOSPITAL_COMMUNITY)
Admission: EM | Admit: 2021-05-11 | Discharge: 2021-05-11 | Disposition: A | Discharge status: Home / Self Care | Payer: Medicaid Other | Attending: Emergency Medicine | Admitting: Emergency Medicine

## 2021-05-11 DIAGNOSIS — R110 Nausea: Secondary | ICD-10-CM | POA: Diagnosis not present

## 2021-05-11 DIAGNOSIS — J029 Acute pharyngitis, unspecified: Secondary | ICD-10-CM | POA: Insufficient documentation

## 2021-05-11 DIAGNOSIS — Z20822 Contact with and (suspected) exposure to covid-19: Secondary | ICD-10-CM | POA: Diagnosis not present

## 2021-05-11 DIAGNOSIS — R519 Headache, unspecified: Secondary | ICD-10-CM | POA: Insufficient documentation

## 2021-05-11 DIAGNOSIS — R059 Cough, unspecified: Secondary | ICD-10-CM | POA: Diagnosis not present

## 2021-05-11 LAB — RESP PANEL BY RT-PCR (FLU A&B, COVID) ARPGX2
Influenza A by PCR: NEGATIVE
Influenza B by PCR: NEGATIVE
SARS Coronavirus 2 by RT PCR: NEGATIVE

## 2021-05-11 LAB — GROUP A STREP BY PCR: Group A Strep by PCR: NOT DETECTED

## 2021-05-11 MED ORDER — ACETAMINOPHEN 325 MG PO TABS: 650.0000 mg | ORAL_TABLET | Freq: Once | ORAL | Status: DC

## 2021-05-11 MED ORDER — ACETAMINOPHEN ER 650 MG PO TBCR: 650.0000 mg | EXTENDED_RELEASE_TABLET | Freq: Three times a day (TID) | ORAL | 0 refills | Status: DC

## 2021-05-11 NOTE — ED Triage Notes (Signed)
Patient reports sore throat and facial pain with n/v/congestion/non-productive cough x 4 days

## 2021-05-11 NOTE — ED Provider Notes (Signed)
[facility]-EMERGENCY DEPT Provider Note   CSN: [id number] Arrival date & time: 05/11/21  1353     History Chief Complaint  Patient presents with   Sore Throat    Cathy Joseph is a 30 y.o. female.  HPI     5 days of sore throat, nausea, headaches, cough. No hx of asthma. No sick contacts. + covid vaccination.  Past Medical History:  Diagnosis Date   Depression     Patient Active Problem List   Diagnosis Date Noted   Vaginal bleeding affecting early pregnancy 09/14/2019   Preterm delivery 09/14/2019    Past Surgical History:  Procedure Laterality Date   DILATION AND CURETTAGE OF UTERUS N/A 09/14/2019   Procedure: DILATATION AND CURETTAGE;  Surgeon: Christeen Douglas, MD;  Location: [facility] ORS;  Service: Gynecology;  Laterality: N/A;     OB History     Gravida  3   Para  3   Term  0   Preterm  3   AB      Living  2      SAB      IAB      Ectopic      Multiple  0   Live Births  2           Family History  Problem Relation Age of Onset   Diabetes Paternal Grandmother    Hypertension Paternal Grandmother     Social History   Tobacco Use   Smoking status: Never   Smokeless tobacco: Never  Substance Use Topics   Alcohol use: No   Drug use: No    Home Medications Prior to Admission medications   Medication Sig Start Date End Date Taking? Authorizing Provider  acetaminophen (TYLENOL 8 HOUR) 650 MG CR tablet Take 1 tablet (650 mg total) by mouth every 8 (eight) hours. 05/11/21  Yes Derwood Kaplan, MD  amoxicillin (AMOXIL) 500 MG capsule Take 1 capsule (500 mg total) by mouth 3 (three) times daily. Patient not taking: Reported on 10/18/2020 09/26/20   Joni Reining, PA-C  ciprofloxacin (CIPRO) 500 MG tablet Take 1 tablet (500 mg total) by mouth 2 (two) times daily. 10/18/20   Bethann Berkshire, MD  HYDROcodone-acetaminophen (NORCO/VICODIN) 5-325 MG tablet Take 1 tablet by mouth every 6 (six) hours as needed for moderate  pain. 10/18/20   Bethann Berkshire, MD  ibuprofen (ADVIL) 600 MG tablet Take 1 tablet (600 mg total) by mouth every 8 (eight) hours as needed for moderate pain or cramping. 03/07/21   Shaune Pollack, MD  levonorgestrel (LILETTA) 19.5 MCG/DAY IUD IUD 1 each by Intrauterine route once. Placed in beginning of 2021 12/14/19   [provider]  metroNIDAZOLE (FLAGYL) 500 MG tablet Take 1 tablet (500 mg total) by mouth 3 (three) times daily. One po bid x 7 days 10/18/20   Bethann Berkshire, MD  ondansetron (ZOFRAN ODT) 4 MG disintegrating tablet Take 1 tablet (4 mg total) by mouth every 8 (eight) hours as needed for nausea or vomiting. 03/07/21   Shaune Pollack, MD  oxyCODONE-acetaminophen (PERCOCET) 7.5-325 MG tablet Take 1 tablet by mouth every 6 (six) hours as needed. Patient not taking: Reported on 10/18/2020 09/26/20   Joni Reining, PA-C  sertraline (ZOLOFT) 50 MG tablet Take 1 tablet (50 mg total) by mouth daily. Patient not taking: Reported on 10/18/2020 09/15/19   Ward, Elenora Fender, MD  promethazine (PHENERGAN) 25 MG tablet Take 1 tablet (25 mg total) by mouth every 6 (six) hours  as needed for nausea or vomiting. 06/15/18 09/12/20  Minna Antis, MD    Allergies    Patient has no known allergies.  Review of Systems   Review of Systems  Constitutional:  Positive for activity change.  HENT:  Positive for sore throat.   Respiratory:  Positive for cough.   Allergic/Immunologic: Negative for immunocompromised state.   Physical Exam Updated Vital Signs BP 109/76 (BP Location: Left Arm)   Pulse 77   Temp 98.3 F (36.8 C) (Oral)   Resp 18   Ht 5\' 3"  (1.6 m)   Wt 131 kg   SpO2 100%   BMI 51.16 kg/m   Physical Exam Vitals and nursing note reviewed.  Constitutional:      Appearance: She is well-developed.  HENT:     Head: Atraumatic.     Mouth/Throat:     Pharynx: Posterior oropharyngeal erythema present. No pharyngeal swelling, oropharyngeal exudate or uvula swelling.  Cardiovascular:      Rate and Rhythm: Normal rate.  Pulmonary:     Effort: Pulmonary effort is normal.  Musculoskeletal:     Cervical back: Normal range of motion and neck supple.  Skin:    General: Skin is warm and dry.  Neurological:     Mental Status: She is alert and oriented to person, place, and time.    ED Results / Procedures / Treatments   Labs (all labs ordered are listed, but only abnormal results are displayed) Labs Reviewed  RESP PANEL BY RT-PCR (FLU A&B, COVID) ARPGX2  GROUP A STREP BY PCR    EKG None  Radiology No results found.  Procedures Procedures   Medications Ordered in ED Medications  acetaminophen (TYLENOL) tablet 650 mg (has no administration in time range)    ED Course  I have reviewed the triage vital signs and the nursing notes.  Pertinent labs & imaging results that were available during my care of the patient were reviewed by me and considered in my medical decision making (see chart for details).    MDM Rules/Calculators/A&P                            DDX includes:  Viral syndrome Influenza Covid-19 Sinusitis Mononucleosis Electrolyte abnormality  Has uri and other systemic symptoms. Non toxic. No resp issues. Anticipate d/c with supportive care.  Final Clinical Impression(s) / ED Diagnoses Final diagnoses:  Viral pharyngitis    Rx / DC Orders ED Discharge Orders          Ordered    acetaminophen (TYLENOL 8 HOUR) 650 MG CR tablet  Every 8 hours        05/11/21 1531             07/11/21, MD 05/11/21 1533

## 2021-05-11 NOTE — Discharge Instructions (Addendum)
We think what you have a covid infection or some other viral infection - the treatment for which is symptomatic relief only, and your body will fight the infection off in a few days. We are prescribing you some meds for pain and fevers. See your primary care doctor in 1 week if the symptoms dont improve.

## 2021-05-11 NOTE — ED Provider Notes (Signed)
**Note Cathy-Identified via Obfuscation** Emergency Medicine Provider Triage Evaluation Note  Cathy Joseph , a 30 y.o. female  was evaluated in triage.  Pt complains of sore throat, nausea, chills, fevers, fatigue x 5 days. Vaccinated for COVID-19.  Review of Systems  Positive: Sore throat, fever, chills, nausea, vomiting Negative: Hematemesis, diarrhea, CP, SOB  Physical Exam  BP 109/76 (BP Location: Left Arm)   Pulse 77   Temp 98.3 F (36.8 C) (Oral)   Resp 18   SpO2 100%  Gen:   Awake, no distress   Resp:  Normal effort  MSK:   Moves extremities without difficulty  Other:  Hoarseness of voice, edematous, erythematous tonsils, anterior cervical LAD, RRR no m/r/g  Medical Decision Making  Medically screening exam initiated at 2:03 PM.  Appropriate orders placed.  Cathy Joseph was informed that the remainder of the evaluation will be completed by another provider, this initial triage assessment does not replace that evaluation, and the importance of remaining in the ED until their evaluation is complete.  This chart was dictated using voice recognition software, Dragon. Despite the best efforts of this provider to proofread and correct errors, errors may still occur which can change documentation meaning.    Paris Lore, PA-C 05/11/21 1406    Gloris Manchester, MD 05/11/21 Silva Bandy

## 2021-06-15 ENCOUNTER — Other Ambulatory Visit: Payer: Self-pay

## 2021-06-15 ENCOUNTER — Emergency Department (HOSPITAL_COMMUNITY)
Admission: EM | Admit: 2021-06-15 | Discharge: 2021-06-15 | Disposition: A | Discharge status: Home / Self Care | Payer: Medicaid Other | Attending: Emergency Medicine | Admitting: Emergency Medicine

## 2021-06-15 ENCOUNTER — Encounter (HOSPITAL_COMMUNITY): Payer: Self-pay

## 2021-06-15 DIAGNOSIS — R109 Unspecified abdominal pain: Secondary | ICD-10-CM | POA: Diagnosis not present

## 2021-06-15 DIAGNOSIS — K029 Dental caries, unspecified: Secondary | ICD-10-CM | POA: Insufficient documentation

## 2021-06-15 DIAGNOSIS — R112 Nausea with vomiting, unspecified: Secondary | ICD-10-CM | POA: Insufficient documentation

## 2021-06-15 DIAGNOSIS — K047 Periapical abscess without sinus: Secondary | ICD-10-CM

## 2021-06-15 DIAGNOSIS — K0889 Other specified disorders of teeth and supporting structures: Secondary | ICD-10-CM | POA: Diagnosis present

## 2021-06-15 DIAGNOSIS — R197 Diarrhea, unspecified: Secondary | ICD-10-CM | POA: Insufficient documentation

## 2021-06-15 LAB — I-STAT BETA HCG BLOOD, ED (MC, WL, AP ONLY): I-stat hCG, quantitative: <5 m[IU]/mL (ref <5)

## 2021-06-15 LAB — URINALYSIS, MICROSCOPIC (REFLEX)

## 2021-06-15 LAB — COMPREHENSIVE METABOLIC PANEL
ALT: 14 U/L (ref 0–44)
AST: 20 U/L (ref 15–41)
Albumin: 3.4 g/dL — ABNORMAL LOW (ref 3.5–5.0)
Alkaline Phosphatase: 66 U/L (ref 38–126)
Anion gap: 4 — ABNORMAL LOW (ref 5–15)
BUN: 11 mg/dL (ref 6–20)
CO2: 22 mmol/L (ref 22–32)
Calcium: 8.7 mg/dL — ABNORMAL LOW (ref 8.9–10.3)
Chloride: 109 mmol/L (ref 98–111)
Creatinine, Ser: 0.89 mg/dL (ref 0.44–1.00)
GFR, Estimated: >60 mL/min (ref >60)
Glucose, Bld: 95 mg/dL (ref 70–99)
Potassium: 4.1 mmol/L (ref 3.5–5.1)
Sodium: 135 mmol/L (ref 135–145)
Total Bilirubin: 0.2 mg/dL — ABNORMAL LOW (ref 0.3–1.2)
Total Protein: 6.4 g/dL — ABNORMAL LOW (ref 6.5–8.1)

## 2021-06-15 LAB — CBC
HCT: 38 % (ref 36.0–46.0)
Hemoglobin: 12.1 g/dL (ref 12.0–15.0)
MCH: 28 pg (ref 26.0–34.0)
MCHC: 31.8 g/dL (ref 30.0–36.0)
MCV: 88 fL (ref 80.0–100.0)
Platelets: 220 10*3/uL (ref 150–400)
RBC: 4.32 MIL/uL (ref 3.87–5.11)
RDW: 13 % (ref 11.5–15.5)
WBC: 3.6 10*3/uL — ABNORMAL LOW (ref 4.0–10.5)
nRBC: 0 % (ref 0.0–0.2)

## 2021-06-15 LAB — URINALYSIS, ROUTINE W REFLEX MICROSCOPIC
Bilirubin Urine: NEGATIVE
Glucose, UA: NEGATIVE mg/dL
Ketones, ur: NEGATIVE mg/dL
Leukocytes,Ua: NEGATIVE
Nitrite: NEGATIVE
Protein, ur: NEGATIVE mg/dL
Specific Gravity, Urine: 1.02 (ref 1.005–1.030)
pH: 6 (ref 5.0–8.0)

## 2021-06-15 LAB — LIPASE, BLOOD: Lipase: 23 U/L (ref 11–51)

## 2021-06-15 MED ORDER — ACETAMINOPHEN 500 MG PO TABS
1000.0000 mg | ORAL_TABLET | Freq: Once | ORAL | Status: AC
  Administered 2021-06-15: 1000 mg via ORAL
  Filled 2021-06-15: qty 2

## 2021-06-15 MED ORDER — ONDANSETRON 4 MG PO TBDP
8.0000 mg | ORAL_TABLET | Freq: Once | ORAL | Status: AC
  Administered 2021-06-15: 8 mg via ORAL
  Filled 2021-06-15: qty 2

## 2021-06-15 MED ORDER — AMOXICILLIN 500 MG PO CAPS: 500.0000 mg | ORAL_CAPSULE | Freq: Three times a day (TID) | ORAL | 0 refills | Status: DC

## 2021-06-15 MED ORDER — ONDANSETRON 8 MG PO TBDP: 8.0000 mg | ORAL_TABLET | Freq: Three times a day (TID) | ORAL | 0 refills | Status: DC | PRN

## 2021-06-15 NOTE — ED Provider Notes (Signed)
[facility] Provider Note   CSN: [id number] Arrival date & time: 06/15/21  [id number]     History Chief Complaint  Patient presents with  . Dental Pain  . Nausea    Cathy Joseph is a 30 y.o. female.  Patient c/o intermittently nausea/vomiting in past week. Also notes right lower dental pain in past two weeks. Symptoms acute onset, moderate, constant, persistent. Has no local dentist. Denies facial redness or swelling. No sore throat or trouble breathing or swallowing. No headache. No constant and/or focal abd pain. No dysuria or gu c/o. Having normal bms, although stools occasionally mildly loose. No severe diarrhea.  No vaginal discharge or bleeding.   The history is provided by the patient and medical records.  Abdominal Pain Associated symptoms: diarrhea   Associated symptoms: no chest pain, no chills, no cough, no fever, no shortness of breath and no sore throat   Diarrhea Associated symptoms: no abdominal pain, no chills, no fever and no headaches   Dental Pain Associated symptoms: no fever, no headaches and no neck pain       Past Medical History:  Diagnosis Date  . Depression     Patient Active Problem List   Diagnosis Date Noted  . Vaginal bleeding affecting early pregnancy 09/14/2019  . Preterm delivery 09/14/2019    Past Surgical History:  Procedure Laterality Date  . DILATION AND CURETTAGE OF UTERUS N/A 09/14/2019   Procedure: DILATATION AND CURETTAGE;  Surgeon: Christeen DouglasBeasley, Bethany, MD;  Location: [facility] ORS;  Service: Gynecology;  Laterality: N/A;     OB History     Gravida  3   Para  3   Term  0   Preterm  3   AB      Living  2      SAB      IAB      Ectopic      Multiple  0   Live Births  2           Family History  Problem Relation Age of Onset  . Diabetes Paternal Grandmother   . Hypertension Paternal Grandmother     Social History   Tobacco Use  . Smoking status: Never  . Smokeless  tobacco: Never  Substance Use Topics  . Alcohol use: No  . Drug use: No    Home Medications Prior to Admission medications   Medication Sig Start Date End Date Taking? Authorizing Provider  acetaminophen (TYLENOL 8 HOUR) 650 MG CR tablet Take 1 tablet (650 mg total) by mouth every 8 (eight) hours. 05/11/21   Derwood KaplanNanavati, Ankit, MD  amoxicillin (AMOXIL) 500 MG capsule Take 1 capsule (500 mg total) by mouth 3 (three) times daily. Patient not taking: Reported on 10/18/2020 09/26/20   Joni ReiningSmith, Ronald K, PA-C  ciprofloxacin (CIPRO) 500 MG tablet Take 1 tablet (500 mg total) by mouth 2 (two) times daily. 10/18/20   Bethann BerkshireZammit, Joseph, MD  HYDROcodone-acetaminophen (NORCO/VICODIN) 5-325 MG tablet Take 1 tablet by mouth every 6 (six) hours as needed for moderate pain. 10/18/20   Bethann BerkshireZammit, Joseph, MD  ibuprofen (ADVIL) 600 MG tablet Take 1 tablet (600 mg total) by mouth every 8 (eight) hours as needed for moderate pain or cramping. 03/07/21   Shaune PollackIsaacs, Cameron, MD  levonorgestrel (LILETTA) 19.5 MCG/DAY IUD IUD 1 each by Intrauterine route once. Placed in beginning of 2021 12/14/19   [provider]  metroNIDAZOLE (FLAGYL) 500 MG tablet Take 1 tablet (500 mg total) by mouth  3 (three) times daily. One po bid x 7 days 10/18/20   Bethann Berkshire, MD  ondansetron (ZOFRAN ODT) 4 MG disintegrating tablet Take 1 tablet (4 mg total) by mouth every 8 (eight) hours as needed for nausea or vomiting. 03/07/21   Shaune Pollack, MD  oxyCODONE-acetaminophen (PERCOCET) 7.5-325 MG tablet Take 1 tablet by mouth every 6 (six) hours as needed. Patient not taking: Reported on 10/18/2020 09/26/20   Joni Reining, PA-C  sertraline (ZOLOFT) 50 MG tablet Take 1 tablet (50 mg total) by mouth daily. Patient not taking: Reported on 10/18/2020 09/15/19   Ward, Elenora Fender, MD  promethazine (PHENERGAN) 25 MG tablet Take 1 tablet (25 mg total) by mouth every 6 (six) hours as needed for nausea or vomiting. 06/15/18 09/12/20  Minna Antis, MD     Allergies    Patient has no known allergies.  Review of Systems   Review of Systems  Constitutional:  Negative for chills and fever.  HENT:  Positive for dental problem. Negative for sore throat.   Eyes:  Negative for redness.  Respiratory:  Negative for cough and shortness of breath.   Cardiovascular:  Negative for chest pain.  Gastrointestinal:  Positive for diarrhea. Negative for abdominal pain.  Genitourinary:  Negative for flank pain.  Musculoskeletal:  Negative for back pain and neck pain.  Skin:  Negative for rash.  Neurological:  Negative for headaches.  Hematological:  Does not bruise/bleed easily.  Psychiatric/Behavioral:  Negative for confusion.    Physical Exam Updated Vital Signs BP 108/64 (BP Location: Right Arm)   Pulse 60   Temp 98.8 F (37.1 C) (Oral)   Resp 17   Ht 1.6 m (5\' 3" )   Wt 131.5 kg   SpO2 100%   BMI 51.37 kg/m   Physical Exam Vitals and nursing note reviewed.  Constitutional:      Appearance: Normal appearance. She is well-developed.  HENT:     Head: Atraumatic.     Nose: Nose normal.     Mouth/Throat:     Mouth: Mucous membranes are moist.     Pharynx: Oropharynx is clear. No oropharyngeal exudate or posterior oropharyngeal erythema.     Comments: Right lower dental decay, mild gum swelling and tenderness. No swelling, pain or tenderness to floor of mouth or neck. No abscess.  Eyes:     General: No scleral icterus.    Conjunctiva/sclera: Conjunctivae normal.     Pupils: Pupils are equal, round, and reactive to light.  Neck:     Trachea: No tracheal deviation.     Comments: Trachea midline. No sts or tenderness. No abscess. Normal movement, no stiffness or rigidity.  Cardiovascular:     Rate and Rhythm: Normal rate and regular rhythm.     Pulses: Normal pulses.     Heart sounds: Normal heart sounds. No murmur heard.   No friction rub. No gallop.  Pulmonary:     Effort: Pulmonary effort is normal. No respiratory distress.      Breath sounds: Normal breath sounds. No stridor.  Abdominal:     General: Bowel sounds are normal. There is no distension.     Palpations: Abdomen is soft. There is no mass.     Tenderness: There is no abdominal tenderness. There is no guarding or rebound.     Hernia: No hernia is present.  Genitourinary:    Comments: No cva tenderness.  Musculoskeletal:        General: No swelling.     Cervical  back: Normal range of motion and neck supple. No rigidity. No muscular tenderness.  Lymphadenopathy:     Cervical: No cervical adenopathy.  Skin:    General: Skin is warm and dry.     Findings: No rash.  Neurological:     Mental Status: She is alert.     Comments: Alert, speech normal. Steady gait.   Psychiatric:        Mood and Affect: Mood normal.    ED Results / Procedures / Treatments   Labs (all labs ordered are listed, but only abnormal results are displayed) Results for orders placed or performed during the hospital encounter of 06/15/21  Lipase, blood  Result Value Ref Range   Lipase 23 11 - 51 U/L  Comprehensive metabolic panel  Result Value Ref Range   Sodium 135 135 - 145 mmol/L   Potassium 4.1 3.5 - 5.1 mmol/L   Chloride 109 98 - 111 mmol/L   CO2 22 22 - 32 mmol/L   Glucose, Bld 95 70 - 99 mg/dL   BUN 11 6 - 20 mg/dL   Creatinine, Ser 0.10 0.44 - 1.00 mg/dL   Calcium 8.7 (L) 8.9 - 10.3 mg/dL   Total Protein 6.4 (L) 6.5 - 8.1 g/dL   Albumin 3.4 (L) 3.5 - 5.0 g/dL   AST 20 15 - 41 U/L   ALT 14 0 - 44 U/L   Alkaline Phosphatase 66 38 - 126 U/L   Total Bilirubin 0.2 (L) 0.3 - 1.2 mg/dL   GFR, Estimated >27 >25 mL/min   Anion gap 4 (L) 5 - 15  CBC  Result Value Ref Range   WBC 3.6 (L) 4.0 - 10.5 K/uL   RBC 4.32 3.87 - 5.11 MIL/uL   Hemoglobin 12.1 12.0 - 15.0 g/dL   HCT 36.6 44.0 - 34.7 %   MCV 88.0 80.0 - 100.0 fL   MCH 28.0 26.0 - 34.0 pg   MCHC 31.8 30.0 - 36.0 g/dL   RDW 42.5 95.6 - 38.7 %   Platelets 220 150 - 400 K/uL   nRBC 0.0 0.0 - 0.2 %  Urinalysis,  Routine w reflex microscopic Urine, Clean Catch  Result Value Ref Range   Color, Urine YELLOW YELLOW   APPearance CLEAR CLEAR   Specific Gravity, Urine 1.020 1.005 - 1.030   pH 6.0 5.0 - 8.0   Glucose, UA NEGATIVE NEGATIVE mg/dL   Hgb urine dipstick TRACE (A) NEGATIVE   Bilirubin Urine NEGATIVE NEGATIVE   Ketones, ur NEGATIVE NEGATIVE mg/dL   Protein, ur NEGATIVE NEGATIVE mg/dL   Nitrite NEGATIVE NEGATIVE   Leukocytes,Ua NEGATIVE NEGATIVE  Urinalysis, Microscopic (reflex)  Result Value Ref Range   RBC / HPF 0-5 0 - 5 RBC/hpf   WBC, UA 0-5 0 - 5 WBC/hpf   Bacteria, UA RARE (A) NONE SEEN   Squamous Epithelial / LPF 6-10 0 - 5   Mucus PRESENT   I-Stat beta hCG blood, ED  Result Value Ref Range   I-stat hCG, quantitative <5.0 <5 mIU/mL   Comment 3             EKG None  Radiology No results found.  Procedures Procedures   Medications Ordered in ED Medications  ondansetron (ZOFRAN-ODT) disintegrating tablet 8 mg (8 mg Oral Given 06/15/21 1004)  acetaminophen (TYLENOL) tablet 1,000 mg (1,000 mg Oral Given 06/15/21 1004)    ED Course  I have reviewed the triage vital signs and the nursing notes.  Pertinent labs & imaging results  that were available during my care of the patient were reviewed by me and considered in my medical decision making (see chart for details).    MDM Rules/Calculators/A&P                           Labs sent.   Reviewed nursing notes and prior charts for additional history.   Acetaminophen po. Zofran po. Po fluids.   No recurrent nausea/vomiting. Abd is soft and non tender.   Labs reviewed/interpreted by me - preg neg. Wbc not elevated.   Recheck abd soft nt, no nv. Tolerating po.  Pt appears stable for d/c.   Rec dental f/u.  Return precautions provided.      Final Clinical Impression(s) / ED Diagnoses Final diagnoses:  None    Rx / DC Orders ED Discharge Orders     None        Cathren Laine, MD 06/15/21 1025

## 2021-06-15 NOTE — ED Triage Notes (Signed)
Pt reports generalized abd pain, n/v/d and right sided dental pain for the past 2 weeks.

## 2021-06-15 NOTE — Discharge Instructions (Addendum)
It was our pleasure to provide your ER care today - we hope that you feel better.  Drink plenty of fluids/stay well hydrated. You may take zofran as need if nauseated.   For tooth - take antibiotic as prescribed, and  take ibuprofen or acetaminophen as need for pain. Follow up with dentist in the coming week - call office to arrange appointment.   Return to ER if worse, new symptoms, fevers, new, worsening or severe pain, severe abdominal pain, persistent vomiting, trouble breathing or swallowing, or other concern.

## 2021-07-23 ENCOUNTER — Emergency Department (HOSPITAL_COMMUNITY)
Admission: EM | Admit: 2021-07-23 | Discharge: 2021-07-23 | Disposition: A | Discharge status: Home / Self Care | Payer: Medicaid Other | Attending: Emergency Medicine | Admitting: Emergency Medicine

## 2021-07-23 ENCOUNTER — Emergency Department (HOSPITAL_COMMUNITY): Payer: Medicaid Other

## 2021-07-23 ENCOUNTER — Other Ambulatory Visit: Payer: Self-pay

## 2021-07-23 DIAGNOSIS — R109 Unspecified abdominal pain: Secondary | ICD-10-CM | POA: Insufficient documentation

## 2021-07-23 DIAGNOSIS — Z5321 Procedure and treatment not carried out due to patient leaving prior to being seen by health care provider: Secondary | ICD-10-CM | POA: Diagnosis not present

## 2021-07-23 DIAGNOSIS — R079 Chest pain, unspecified: Secondary | ICD-10-CM | POA: Insufficient documentation

## 2021-07-23 LAB — CBC WITH DIFFERENTIAL/PLATELET
Abs Immature Granulocytes: 0 10*3/uL (ref 0.00–0.07)
Basophils Absolute: 0 10*3/uL (ref 0.0–0.1)
Basophils Relative: 1 %
Eosinophils Absolute: 0.1 10*3/uL (ref 0.0–0.5)
Eosinophils Relative: 2 %
HCT: 37.5 % (ref 36.0–46.0)
Hemoglobin: 11.7 g/dL — ABNORMAL LOW (ref 12.0–15.0)
Immature Granulocytes: 0 %
Lymphocytes Relative: 40 %
Lymphs Abs: 1.4 10*3/uL (ref 0.7–4.0)
MCH: 27.8 pg (ref 26.0–34.0)
MCHC: 31.2 g/dL (ref 30.0–36.0)
MCV: 89.1 fL (ref 80.0–100.0)
Monocytes Absolute: 0.4 10*3/uL (ref 0.1–1.0)
Monocytes Relative: 10 %
Neutro Abs: 1.7 10*3/uL (ref 1.7–7.7)
Neutrophils Relative %: 47 %
Platelets: 229 10*3/uL (ref 150–400)
RBC: 4.21 MIL/uL (ref 3.87–5.11)
RDW: 13.1 % (ref 11.5–15.5)
WBC: 3.5 10*3/uL — ABNORMAL LOW (ref 4.0–10.5)
nRBC: 0 % (ref 0.0–0.2)

## 2021-07-23 LAB — URINALYSIS, ROUTINE W REFLEX MICROSCOPIC
Bilirubin Urine: NEGATIVE
Glucose, UA: NEGATIVE mg/dL
Hgb urine dipstick: NEGATIVE
Ketones, ur: NEGATIVE mg/dL
Leukocytes,Ua: NEGATIVE
Nitrite: NEGATIVE
Protein, ur: NEGATIVE mg/dL
Specific Gravity, Urine: 1.019 (ref 1.005–1.030)
pH: 6 (ref 5.0–8.0)

## 2021-07-23 LAB — LIPASE, BLOOD: Lipase: 24 U/L (ref 11–51)

## 2021-07-23 LAB — TROPONIN I (HIGH SENSITIVITY)
Troponin I (High Sensitivity): 2 ng/L (ref <18)
Troponin I (High Sensitivity): <2 ng/L (ref <18)

## 2021-07-23 LAB — COMPREHENSIVE METABOLIC PANEL
ALT: 11 U/L (ref 0–44)
AST: 20 U/L (ref 15–41)
Albumin: 3.3 g/dL — ABNORMAL LOW (ref 3.5–5.0)
Alkaline Phosphatase: 60 U/L (ref 38–126)
Anion gap: 7 (ref 5–15)
BUN: 14 mg/dL (ref 6–20)
CO2: 24 mmol/L (ref 22–32)
Calcium: 9 mg/dL (ref 8.9–10.3)
Chloride: 106 mmol/L (ref 98–111)
Creatinine, Ser: 0.87 mg/dL (ref 0.44–1.00)
GFR, Estimated: >60 mL/min (ref >60)
Glucose, Bld: 95 mg/dL (ref 70–99)
Potassium: 4.1 mmol/L (ref 3.5–5.1)
Sodium: 137 mmol/L (ref 135–145)
Total Bilirubin: 0.5 mg/dL (ref 0.3–1.2)
Total Protein: 6.3 g/dL — ABNORMAL LOW (ref 6.5–8.1)

## 2021-07-23 LAB — I-STAT BETA HCG BLOOD, ED (MC, WL, AP ONLY): I-stat hCG, quantitative: <5 m[IU]/mL (ref <5)

## 2021-07-23 NOTE — ED Notes (Signed)
No answer

## 2021-07-23 NOTE — ED Notes (Signed)
Pt called 3x no answer  

## 2021-07-23 NOTE — ED Notes (Signed)
Pt called multiple times no answer 

## 2021-07-23 NOTE — ED Triage Notes (Signed)
Pt. Stated, I started having stomach pain and chest pain last night.

## 2021-10-14 ENCOUNTER — Other Ambulatory Visit: Payer: Self-pay

## 2021-10-14 ENCOUNTER — Encounter (HOSPITAL_COMMUNITY): Payer: Self-pay | Admitting: Emergency Medicine

## 2021-10-14 ENCOUNTER — Emergency Department (HOSPITAL_COMMUNITY): Payer: Medicaid Other

## 2021-10-14 ENCOUNTER — Emergency Department (HOSPITAL_COMMUNITY)
Admission: EM | Admit: 2021-10-14 | Discharge: 2021-10-14 | Disposition: A | Discharge status: Home / Self Care | Payer: Medicaid Other | Attending: Student | Admitting: Student

## 2021-10-14 DIAGNOSIS — R1032 Left lower quadrant pain: Secondary | ICD-10-CM | POA: Diagnosis not present

## 2021-10-14 DIAGNOSIS — R197 Diarrhea, unspecified: Secondary | ICD-10-CM | POA: Diagnosis not present

## 2021-10-14 DIAGNOSIS — R9431 Abnormal electrocardiogram [ECG] [EKG]: Secondary | ICD-10-CM | POA: Diagnosis not present

## 2021-10-14 DIAGNOSIS — R112 Nausea with vomiting, unspecified: Secondary | ICD-10-CM | POA: Diagnosis not present

## 2021-10-14 DIAGNOSIS — R109 Unspecified abdominal pain: Secondary | ICD-10-CM

## 2021-10-14 DIAGNOSIS — N898 Other specified noninflammatory disorders of vagina: Secondary | ICD-10-CM | POA: Diagnosis not present

## 2021-10-14 LAB — CBC WITH DIFFERENTIAL/PLATELET
Abs Immature Granulocytes: 0.02 10*3/uL (ref 0.00–0.07)
Basophils Absolute: 0 10*3/uL (ref 0.0–0.1)
Basophils Relative: 0 %
Eosinophils Absolute: 0.1 10*3/uL (ref 0.0–0.5)
Eosinophils Relative: 1 %
HCT: 38.8 % (ref 36.0–46.0)
Hemoglobin: 12.2 g/dL (ref 12.0–15.0)
Immature Granulocytes: 1 %
Lymphocytes Relative: 34 %
Lymphs Abs: 1.4 10*3/uL (ref 0.7–4.0)
MCH: 28.3 pg (ref 26.0–34.0)
MCHC: 31.4 g/dL (ref 30.0–36.0)
MCV: 90 fL (ref 80.0–100.0)
Monocytes Absolute: 0.3 10*3/uL (ref 0.1–1.0)
Monocytes Relative: 8 %
Neutro Abs: 2.4 10*3/uL (ref 1.7–7.7)
Neutrophils Relative %: 56 %
Platelets: 218 10*3/uL (ref 150–400)
RBC: 4.31 MIL/uL (ref 3.87–5.11)
RDW: 13.2 % (ref 11.5–15.5)
WBC: 4.2 10*3/uL (ref 4.0–10.5)
nRBC: 0 % (ref 0.0–0.2)

## 2021-10-14 LAB — URINALYSIS, ROUTINE W REFLEX MICROSCOPIC
Bilirubin Urine: NEGATIVE
Glucose, UA: NEGATIVE mg/dL
Hgb urine dipstick: NEGATIVE
Ketones, ur: NEGATIVE mg/dL
Leukocytes,Ua: NEGATIVE
Nitrite: NEGATIVE
Protein, ur: NEGATIVE mg/dL
Specific Gravity, Urine: 1.017 (ref 1.005–1.030)
pH: 7 (ref 5.0–8.0)

## 2021-10-14 LAB — COMPREHENSIVE METABOLIC PANEL
ALT: 11 U/L (ref 0–44)
AST: 18 U/L (ref 15–41)
Albumin: 3.7 g/dL (ref 3.5–5.0)
Alkaline Phosphatase: 58 U/L (ref 38–126)
Anion gap: 5 (ref 5–15)
BUN: 12 mg/dL (ref 6–20)
CO2: 25 mmol/L (ref 22–32)
Calcium: 8.7 mg/dL — ABNORMAL LOW (ref 8.9–10.3)
Chloride: 106 mmol/L (ref 98–111)
Creatinine, Ser: 0.83 mg/dL (ref 0.44–1.00)
GFR, Estimated: >60 mL/min (ref >60)
Glucose, Bld: 86 mg/dL (ref 70–99)
Potassium: 4.1 mmol/L (ref 3.5–5.1)
Sodium: 136 mmol/L (ref 135–145)
Total Bilirubin: 0.7 mg/dL (ref 0.3–1.2)
Total Protein: 7.1 g/dL (ref 6.5–8.1)

## 2021-10-14 LAB — WET PREP, GENITAL
Clue Cells Wet Prep HPF POC: NONE SEEN
Sperm: NONE SEEN
Trich, Wet Prep: NONE SEEN
Yeast Wet Prep HPF POC: NONE SEEN

## 2021-10-14 LAB — LIPASE, BLOOD: Lipase: 24 U/L (ref 11–51)

## 2021-10-14 LAB — I-STAT BETA HCG BLOOD, ED (MC, WL, AP ONLY): I-stat hCG, quantitative: <5 m[IU]/mL (ref <5)

## 2021-10-14 MED ORDER — DOXYCYCLINE HYCLATE 100 MG PO CAPS: 100.0000 mg | ORAL_CAPSULE | Freq: Two times a day (BID) | ORAL | 0 refills | Status: AC

## 2021-10-14 MED ORDER — IOHEXOL 350 MG/ML SOLN
100.0000 mL | Freq: Once | INTRAVENOUS | Status: AC | PRN
Start: 2021-10-14 — End: 2021-10-14
  Administered 2021-10-14: 100 mL via INTRAVENOUS

## 2021-10-14 MED ORDER — KETOROLAC TROMETHAMINE 15 MG/ML IJ SOLN
15.0000 mg | Freq: Once | INTRAMUSCULAR | Status: AC
  Administered 2021-10-14: 15 mg via INTRAVENOUS
  Filled 2021-10-14: qty 1

## 2021-10-14 MED ORDER — LIDOCAINE HCL (PF) 1 % IJ SOLN
1.0000 mL | Freq: Once | INTRAMUSCULAR | Status: AC
  Administered 2021-10-14: 1 mL
  Filled 2021-10-14: qty 30

## 2021-10-14 MED ORDER — DOXYCYCLINE HYCLATE 100 MG PO TABS
100.0000 mg | ORAL_TABLET | Freq: Once | ORAL | Status: AC
  Administered 2021-10-14: 100 mg via ORAL
  Filled 2021-10-14: qty 1

## 2021-10-14 MED ORDER — CEFTRIAXONE SODIUM 1 G IJ SOLR
500.0000 mg | Freq: Once | INTRAMUSCULAR | Status: AC
  Administered 2021-10-14: 500 mg via INTRAMUSCULAR
  Filled 2021-10-14: qty 10

## 2021-10-14 NOTE — ED Triage Notes (Signed)
Patient c/o abdominal pain with N/V/D x 1 week. Denies any urinary symptoms. No relief with tylenol. States she has been able to eat but immediately has a BM. States she has been here for same and was diagnosed with diverticulitis.

## 2021-10-14 NOTE — ED Provider Notes (Signed)
3:30 PM-checkout from Dr., To evaluate patient after return of wet prep for additional treatment as needed.  Patient is being treated empirically for STD with Rocephin and doxycycline.  5:20 PM-wet prep shows only white cells.  No trichomonas or clue cells.  No indication to change treatment plan.  Vitals signs are reassuring.   Mancel Bale, MD 10/14/21 1726

## 2021-10-14 NOTE — Discharge Instructions (Signed)
We are treating you with antibiotics for possible vaginal infection.  The results from the samples taken will be available in MyChart, by tomorrow.  We sent a prescription to your pharmacy to continue treating the discomfort and discharge.  Follow-up with your family doctor in a week or 2 for checkup if you are still having symptoms.

## 2021-10-14 NOTE — ED Provider Notes (Signed)
[facility]-EMERGENCY DEPT Provider Note   CSN: [id number] Arrival date & time: 10/14/21  2025     History  Chief Complaint  Patient presents with   Abdominal Pain    Cathy Joseph is a 31 y.o. female who presents emergency department for evaluation of abdominal pain.  She states that she has had nausea, vomiting, diarrhea for approximately 1 week and pain is worse in the left lower quadrant.  She states that this feels similar to her previous history of diverticulitis.  She also endorses vaginal discharge that she describes as white and thick but she has no dysuria, increased frequency, vaginal pain or itching.  Denies chest pain, shortness of breath, fever or other systemic symptoms.   Abdominal Pain Associated symptoms: diarrhea, nausea and vomiting       Home Medications Prior to Admission medications   Medication Sig Start Date End Date Taking? Authorizing Provider  acetaminophen (TYLENOL) 500 MG tablet Take 500 mg by mouth every 6 (six) hours as needed for mild pain.   Yes [provider]  doxycycline (VIBRAMYCIN) 100 MG capsule Take 1 capsule (100 mg total) by mouth 2 (two) times daily for 7 days. 10/14/21 10/21/21 Yes Imonie Tuch, MD  levonorgestrel (LILETTA) 19.5 MCG/DAY IUD IUD 1 each by Intrauterine route once. Placed in beginning of 2021 Patient not taking: Reported on 10/14/2021 12/14/19   [provider]  ondansetron (ZOFRAN ODT) 8 MG disintegrating tablet Take 1 tablet (8 mg total) by mouth every 8 (eight) hours as needed for nausea or vomiting. Patient not taking: Reported on 10/14/2021 06/15/21   Cathren Laine, MD  sertraline (ZOLOFT) 50 MG tablet Take 1 tablet (50 mg total) by mouth daily. Patient not taking: Reported on 10/18/2020 09/15/19   Ward, Elenora Fender, MD  promethazine (PHENERGAN) 25 MG tablet Take 1 tablet (25 mg total) by mouth every 6 (six) hours as needed for nausea or vomiting. 06/15/18 09/12/20  Minna Antis, MD       Allergies    Patient has no known allergies.    Review of Systems   Review of Systems  Gastrointestinal:  Positive for abdominal pain, diarrhea, nausea and vomiting.   Physical Exam Updated Vital Signs BP 102/65    Pulse (!) 57    Temp 98.3 F (36.8 C) (Oral)    Resp 18    Ht 5\' 3"  (1.6 m)    Wt 127 kg    SpO2 100%    BMI 49.60 kg/m  Physical Exam Vitals and nursing note reviewed.  Constitutional:      General: She is not in acute distress.    Appearance: She is well-developed.  HENT:     Head: Normocephalic and atraumatic.  Eyes:     Conjunctiva/sclera: Conjunctivae normal.  Cardiovascular:     Rate and Rhythm: Normal rate and regular rhythm.     Heart sounds: No murmur heard. Pulmonary:     Effort: Pulmonary effort is normal. No respiratory distress.     Breath sounds: Normal breath sounds.  Abdominal:     Palpations: Abdomen is soft.     Tenderness: There is abdominal tenderness in the left lower quadrant.  Genitourinary:    Vagina: Vaginal discharge present.     Cervix: Discharge present.     Comments: IUD strings visualized Musculoskeletal:        General: No swelling.     Cervical back: Neck supple.  Skin:    General: Skin is warm and dry.  Capillary Refill: Capillary refill takes less than 2 seconds.  Neurological:     Mental Status: She is alert.  Psychiatric:        Mood and Affect: Mood normal.    ED Results / Procedures / Treatments   Labs (all labs ordered are listed, but only abnormal results are displayed) Labs Reviewed  COMPREHENSIVE METABOLIC PANEL - Abnormal; Notable for the following components:      Result Value   Calcium 8.7 (*)    All other components within normal limits  URINALYSIS, ROUTINE W REFLEX MICROSCOPIC - Abnormal; Notable for the following components:   Color, Urine STRAW (*)    All other components within normal limits  WET PREP, GENITAL  CBC WITH DIFFERENTIAL/PLATELET  LIPASE, BLOOD  PREGNANCY, URINE  I-STAT BETA  HCG BLOOD, ED ([facility], [facility], [facility] ONLY)  GC/CHLAMYDIA PROBE AMP ([place]) NOT [facility]    EKG None  Radiology CT ABDOMEN PELVIS W CONTRAST  Result Date: 10/14/2021 CLINICAL DATA:  Left lower quadrant abdominal pain EXAM: CT ABDOMEN AND PELVIS WITH CONTRAST TECHNIQUE: Multidetector CT imaging of the abdomen and pelvis was performed using the standard protocol following bolus administration of intravenous contrast. CONTRAST:  100mL OMNIPAQUE IOHEXOL 350 MG/ML SOLN COMPARISON:  03/07/2021 FINDINGS: Lower chest: No acute abnormality. Hepatobiliary: No solid liver abnormality is seen. No gallstones, gallbladder wall thickening, or biliary dilatation. Pancreas: Unremarkable. No pancreatic ductal dilatation or surrounding inflammatory changes. Spleen: Normal in size without significant abnormality. Adrenals/Urinary Tract: Adrenal glands are unremarkable. Kidneys are normal, without renal calculi, solid lesion, or hydronephrosis. Bladder is unremarkable. Stomach/Bowel: Stomach is within normal limits. Appendix appears normal. No evidence of bowel wall thickening, distention, or inflammatory changes. Severe pancolonic diverticulosis. Vascular/Lymphatic: No significant vascular findings are present. No enlarged abdominal or pelvic lymph nodes. Reproductive: No mass or other significant abnormality. IUD present in the fundal endometrial cavity. Other: No abdominal wall hernia or abnormality. No abdominopelvic ascites. Musculoskeletal: No acute or significant osseous findings. IMPRESSION: 1. Severe pancolonic diverticulosis without evidence of acute diverticulitis. 2. No acute findings of the abdomen or pelvis to explain left lower quadrant pain. Electronically Signed   By: Jearld LeschAlex D Bibbey M.D.   On: 10/14/2021 14:36    Procedures .1-3 Lead EKG Interpretation Performed by: Glendora ScoreKommor, Aaran Enberg, MD Authorized by: Glendora ScoreKommor, Deletha Jaffee, MD     Interpretation: normal     ECG rate assessment: normal     Rhythm: sinus rhythm      Ectopy: none     Conduction: normal      Medications Ordered in ED Medications  ketorolac (TORADOL) 15 MG/ML injection 15 mg (15 mg Intravenous Given 10/14/21 1213)  iohexol (OMNIPAQUE) 350 MG/ML injection 100 mL (100 mLs Intravenous Contrast Given 10/14/21 1410)  cefTRIAXone (ROCEPHIN) injection 500 mg (500 mg Intramuscular Given 10/14/21 1542)  lidocaine (PF) (XYLOCAINE) 1 % injection 1 mL (1 mL Other Given 10/14/21 1542)  doxycycline (VIBRA-TABS) tablet 100 mg (100 mg Oral Given 10/14/21 1542)    ED Course/ Medical Decision Making/ A&P                           Medical Decision Making  Patient seen emergency department for evaluation of abdominal pain.  Physical exam reveals thick white discharge in the vaginal canal and around the os as well as left lower quadrant tenderness to palpation but is otherwise unremarkable.  Laboratory evaluation largely unremarkable.  Urinalysis negative.  Pregnancy negative.  CT abdomen pelvis  with severe diverticulosis but no diverticulitis.  I independently reviewed this CT scan and my findings are consistent with the radiology read.  Patient empirically treated for gonorrhea and chlamydia and she will be discharged with a week course of doxycycline.  Wet prep is currently pending at time of discharge.  Patient signed out to oncoming provider.  Please see provider signout for continuation of work-up.  Anticipate discharge home.    Final Clinical Impression(s) / ED Diagnoses Final diagnoses:  Abdominal pain, unspecified abdominal location  Vaginal discharge    Rx / DC Orders ED Discharge Orders          Ordered    doxycycline (VIBRAMYCIN) 100 MG capsule  2 times daily        10/14/21 1536              Velton Roselle, [place], MD 10/14/21 1606

## 2021-10-15 LAB — GC/CHLAMYDIA PROBE AMP (~~LOC~~) NOT AT ARMC
Chlamydia: NEGATIVE
Comment: NEGATIVE
Comment: NORMAL
Neisseria Gonorrhea: NEGATIVE

## 2022-04-04 IMAGING — CR DG KNEE COMPLETE 4+V*L*
4 series · 4 of 4 positions shown · non-contrast
Comparison: 07/24/2016

CLINICAL DATA: Left knee pain since last week

EXAM:
LEFT KNEE - COMPLETE 4+ VIEW

[knee ap]
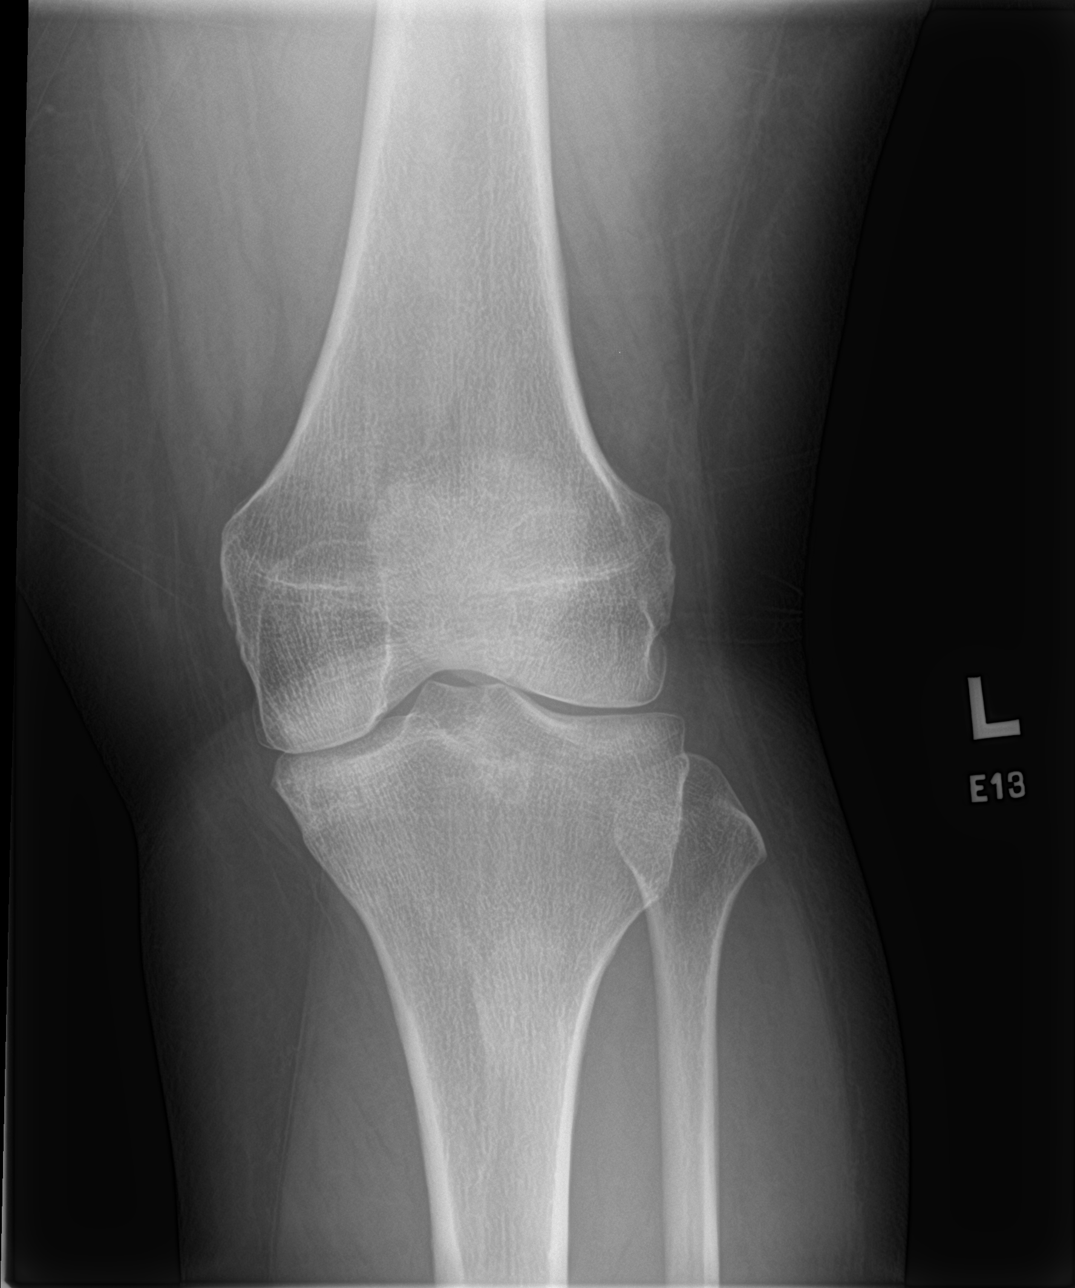

[knee obl (1 of 2)]
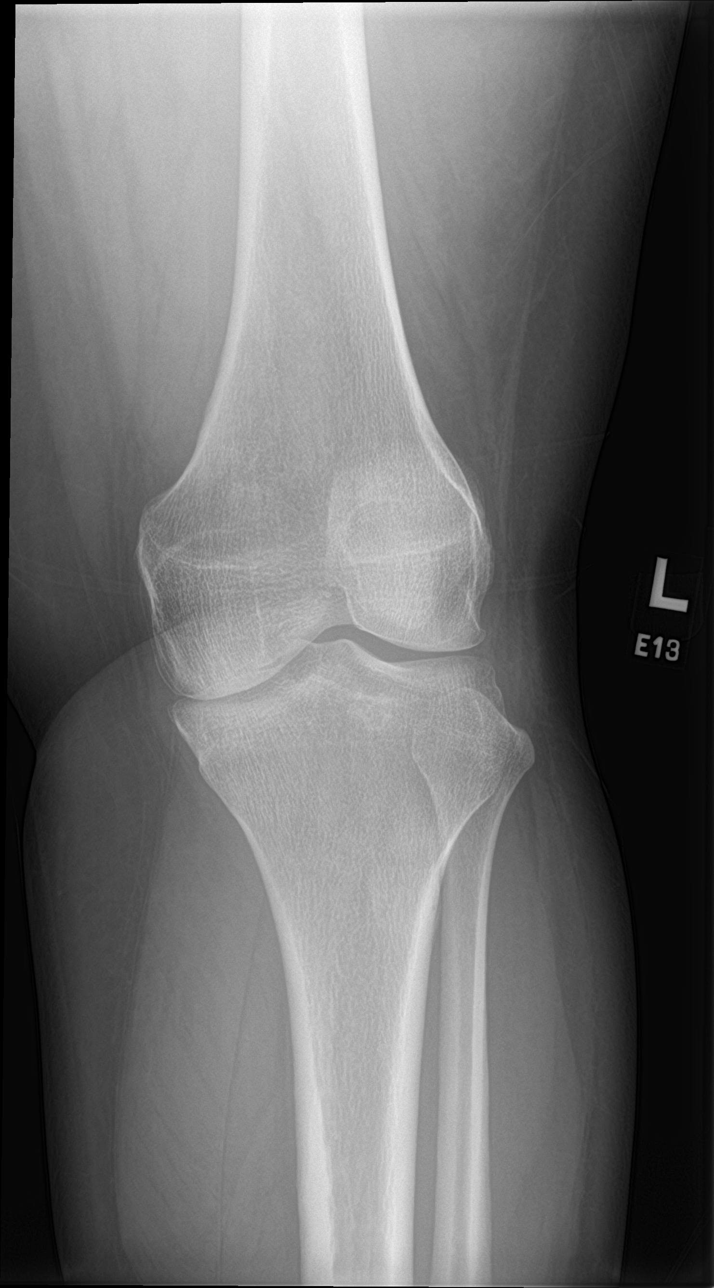

[knee obl (2 of 2)]
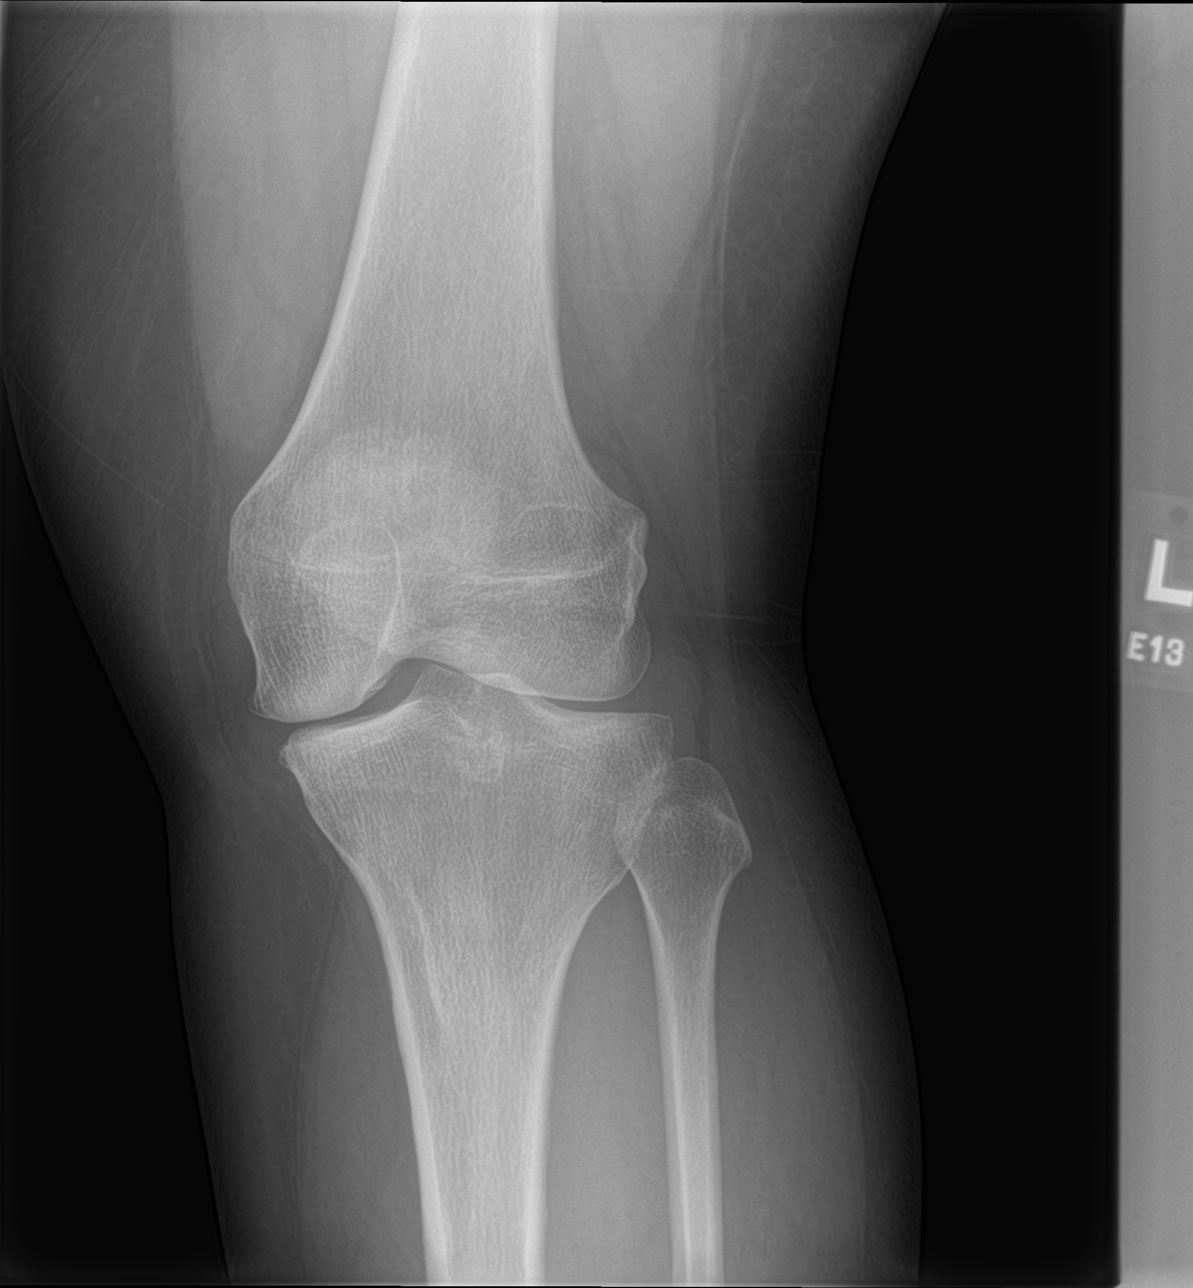

[knee lat]
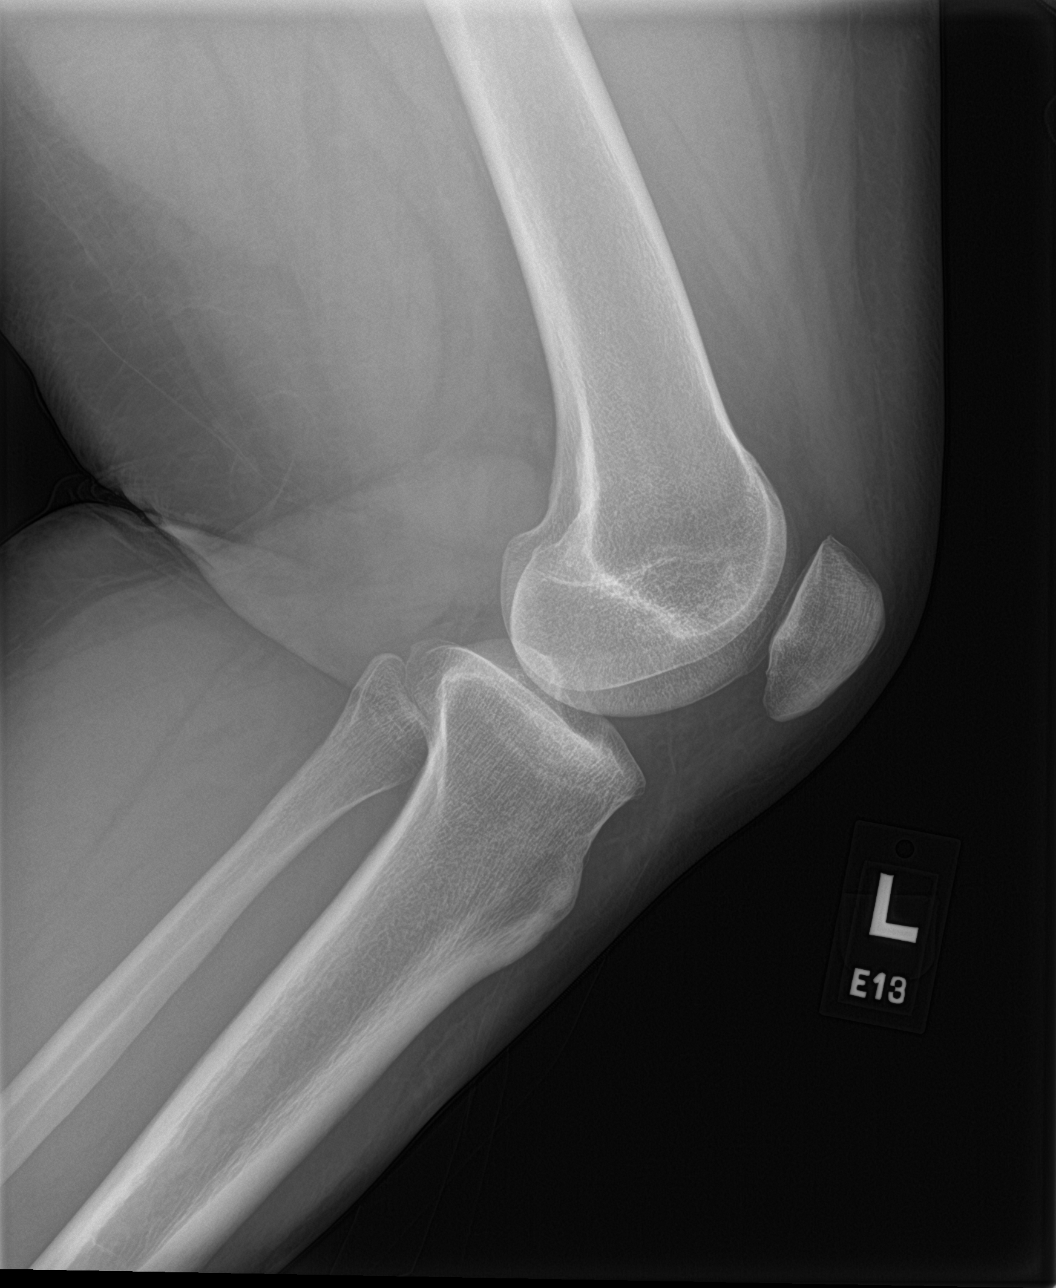

[4 of 4 positions shown; findings below may reference images not displayed]

FINDINGS: No evidence of fracture, dislocation, or joint effusion. No evidence
of arthropathy or other focal bone abnormality. Soft tissues are
unremarkable.
IMPRESSION: Negative.

## 2022-05-02 DIAGNOSIS — F4312 Post-traumatic stress disorder, chronic: Secondary | ICD-10-CM | POA: Diagnosis not present

## 2023-07-25 DIAGNOSIS — F331 Major depressive disorder, recurrent, moderate: Secondary | ICD-10-CM | POA: Diagnosis not present

## 2023-08-08 DIAGNOSIS — F331 Major depressive disorder, recurrent, moderate: Secondary | ICD-10-CM | POA: Diagnosis not present

## 2023-08-09 DIAGNOSIS — F331 Major depressive disorder, recurrent, moderate: Secondary | ICD-10-CM | POA: Diagnosis not present

## 2023-08-15 DIAGNOSIS — F331 Major depressive disorder, recurrent, moderate: Secondary | ICD-10-CM | POA: Diagnosis not present

## 2023-08-16 DIAGNOSIS — F331 Major depressive disorder, recurrent, moderate: Secondary | ICD-10-CM | POA: Diagnosis not present

## 2023-08-20 DIAGNOSIS — F331 Major depressive disorder, recurrent, moderate: Secondary | ICD-10-CM | POA: Diagnosis not present

## 2023-08-23 DIAGNOSIS — F331 Major depressive disorder, recurrent, moderate: Secondary | ICD-10-CM | POA: Diagnosis not present

## 2023-08-26 DIAGNOSIS — F331 Major depressive disorder, recurrent, moderate: Secondary | ICD-10-CM | POA: Diagnosis not present

## 2023-08-28 DIAGNOSIS — F331 Major depressive disorder, recurrent, moderate: Secondary | ICD-10-CM | POA: Diagnosis not present

## 2023-09-09 DIAGNOSIS — F331 Major depressive disorder, recurrent, moderate: Secondary | ICD-10-CM | POA: Diagnosis not present

## 2023-09-26 DIAGNOSIS — F331 Major depressive disorder, recurrent, moderate: Secondary | ICD-10-CM | POA: Diagnosis not present

## 2023-09-27 DIAGNOSIS — F331 Major depressive disorder, recurrent, moderate: Secondary | ICD-10-CM | POA: Diagnosis not present

## 2023-10-03 DIAGNOSIS — F331 Major depressive disorder, recurrent, moderate: Secondary | ICD-10-CM | POA: Diagnosis not present

## 2023-10-07 DIAGNOSIS — F331 Major depressive disorder, recurrent, moderate: Secondary | ICD-10-CM | POA: Diagnosis not present

## 2023-10-11 DIAGNOSIS — F331 Major depressive disorder, recurrent, moderate: Secondary | ICD-10-CM | POA: Diagnosis not present

## 2023-10-13 DIAGNOSIS — F331 Major depressive disorder, recurrent, moderate: Secondary | ICD-10-CM | POA: Diagnosis not present

## 2023-10-15 DIAGNOSIS — F331 Major depressive disorder, recurrent, moderate: Secondary | ICD-10-CM | POA: Diagnosis not present

## 2023-10-20 DIAGNOSIS — F331 Major depressive disorder, recurrent, moderate: Secondary | ICD-10-CM | POA: Diagnosis not present

## 2023-10-23 DIAGNOSIS — F331 Major depressive disorder, recurrent, moderate: Secondary | ICD-10-CM | POA: Diagnosis not present

## 2023-10-28 DIAGNOSIS — F331 Major depressive disorder, recurrent, moderate: Secondary | ICD-10-CM | POA: Diagnosis not present

## 2023-10-31 DIAGNOSIS — F331 Major depressive disorder, recurrent, moderate: Secondary | ICD-10-CM | POA: Diagnosis not present

## 2023-11-05 DIAGNOSIS — F331 Major depressive disorder, recurrent, moderate: Secondary | ICD-10-CM | POA: Diagnosis not present

## 2023-11-08 DIAGNOSIS — F331 Major depressive disorder, recurrent, moderate: Secondary | ICD-10-CM | POA: Diagnosis not present

## 2023-11-14 DIAGNOSIS — F331 Major depressive disorder, recurrent, moderate: Secondary | ICD-10-CM | POA: Diagnosis not present

## 2023-11-15 DIAGNOSIS — F331 Major depressive disorder, recurrent, moderate: Secondary | ICD-10-CM | POA: Diagnosis not present

## 2023-11-19 DIAGNOSIS — F331 Major depressive disorder, recurrent, moderate: Secondary | ICD-10-CM | POA: Diagnosis not present

## 2023-11-22 DIAGNOSIS — F331 Major depressive disorder, recurrent, moderate: Secondary | ICD-10-CM | POA: Diagnosis not present

## 2023-11-27 DIAGNOSIS — F331 Major depressive disorder, recurrent, moderate: Secondary | ICD-10-CM | POA: Diagnosis not present

## 2023-11-28 DIAGNOSIS — F331 Major depressive disorder, recurrent, moderate: Secondary | ICD-10-CM | POA: Diagnosis not present

## 2023-12-04 DIAGNOSIS — F331 Major depressive disorder, recurrent, moderate: Secondary | ICD-10-CM | POA: Diagnosis not present

## 2023-12-06 DIAGNOSIS — F331 Major depressive disorder, recurrent, moderate: Secondary | ICD-10-CM | POA: Diagnosis not present

## 2023-12-12 DIAGNOSIS — F331 Major depressive disorder, recurrent, moderate: Secondary | ICD-10-CM | POA: Diagnosis not present

## 2023-12-13 DIAGNOSIS — F331 Major depressive disorder, recurrent, moderate: Secondary | ICD-10-CM | POA: Diagnosis not present

## 2023-12-18 DIAGNOSIS — F331 Major depressive disorder, recurrent, moderate: Secondary | ICD-10-CM | POA: Diagnosis not present

## 2023-12-20 DIAGNOSIS — F331 Major depressive disorder, recurrent, moderate: Secondary | ICD-10-CM | POA: Diagnosis not present

## 2023-12-22 DIAGNOSIS — F331 Major depressive disorder, recurrent, moderate: Secondary | ICD-10-CM | POA: Diagnosis not present

## 2023-12-26 DIAGNOSIS — F331 Major depressive disorder, recurrent, moderate: Secondary | ICD-10-CM | POA: Diagnosis not present

## 2024-01-02 DIAGNOSIS — F331 Major depressive disorder, recurrent, moderate: Secondary | ICD-10-CM | POA: Diagnosis not present

## 2024-01-03 DIAGNOSIS — F331 Major depressive disorder, recurrent, moderate: Secondary | ICD-10-CM | POA: Diagnosis not present

## 2024-01-08 DIAGNOSIS — F331 Major depressive disorder, recurrent, moderate: Secondary | ICD-10-CM | POA: Diagnosis not present

## 2024-01-09 DIAGNOSIS — F331 Major depressive disorder, recurrent, moderate: Secondary | ICD-10-CM | POA: Diagnosis not present

## 2024-01-16 DIAGNOSIS — F331 Major depressive disorder, recurrent, moderate: Secondary | ICD-10-CM | POA: Diagnosis not present

## 2024-01-17 DIAGNOSIS — F331 Major depressive disorder, recurrent, moderate: Secondary | ICD-10-CM | POA: Diagnosis not present

## 2024-01-23 DIAGNOSIS — F331 Major depressive disorder, recurrent, moderate: Secondary | ICD-10-CM | POA: Diagnosis not present

## 2024-01-24 DIAGNOSIS — F331 Major depressive disorder, recurrent, moderate: Secondary | ICD-10-CM | POA: Diagnosis not present

## 2024-01-28 DIAGNOSIS — F331 Major depressive disorder, recurrent, moderate: Secondary | ICD-10-CM | POA: Diagnosis not present

## 2024-01-30 DIAGNOSIS — F331 Major depressive disorder, recurrent, moderate: Secondary | ICD-10-CM | POA: Diagnosis not present

## 2024-02-02 DIAGNOSIS — F331 Major depressive disorder, recurrent, moderate: Secondary | ICD-10-CM | POA: Diagnosis not present

## 2024-02-06 DIAGNOSIS — F331 Major depressive disorder, recurrent, moderate: Secondary | ICD-10-CM | POA: Diagnosis not present

## 2024-02-12 DIAGNOSIS — F331 Major depressive disorder, recurrent, moderate: Secondary | ICD-10-CM | POA: Diagnosis not present

## 2024-02-13 DIAGNOSIS — F331 Major depressive disorder, recurrent, moderate: Secondary | ICD-10-CM | POA: Diagnosis not present

## 2024-02-17 DIAGNOSIS — F331 Major depressive disorder, recurrent, moderate: Secondary | ICD-10-CM | POA: Diagnosis not present

## 2024-02-20 DIAGNOSIS — F331 Major depressive disorder, recurrent, moderate: Secondary | ICD-10-CM | POA: Diagnosis not present

## 2024-02-23 DIAGNOSIS — F331 Major depressive disorder, recurrent, moderate: Secondary | ICD-10-CM | POA: Diagnosis not present

## 2024-02-25 DIAGNOSIS — F331 Major depressive disorder, recurrent, moderate: Secondary | ICD-10-CM | POA: Diagnosis not present

## 2024-03-01 DIAGNOSIS — F331 Major depressive disorder, recurrent, moderate: Secondary | ICD-10-CM | POA: Diagnosis not present

## 2024-03-05 DIAGNOSIS — F331 Major depressive disorder, recurrent, moderate: Secondary | ICD-10-CM | POA: Diagnosis not present

## 2024-03-08 DIAGNOSIS — F331 Major depressive disorder, recurrent, moderate: Secondary | ICD-10-CM | POA: Diagnosis not present

## 2024-03-13 DIAGNOSIS — F331 Major depressive disorder, recurrent, moderate: Secondary | ICD-10-CM | POA: Diagnosis not present

## 2024-03-15 DIAGNOSIS — F331 Major depressive disorder, recurrent, moderate: Secondary | ICD-10-CM | POA: Diagnosis not present

## 2024-03-16 DIAGNOSIS — F331 Major depressive disorder, recurrent, moderate: Secondary | ICD-10-CM | POA: Diagnosis not present

## 2024-03-22 ENCOUNTER — Telehealth: Admitting: Physician Assistant

## 2024-03-22 DIAGNOSIS — A084 Viral intestinal infection, unspecified: Secondary | ICD-10-CM | POA: Diagnosis not present

## 2024-03-22 MED ORDER — ONDANSETRON 4 MG PO TBDP
4.0000 mg | ORAL_TABLET | Freq: Three times a day (TID) | ORAL | 0 refills | Status: DC | PRN
Start: 2024-03-22 — End: 2024-05-24

## 2024-03-22 NOTE — Patient Instructions (Signed)
 Cathy [facility], thank you for joining Angelia Kelp, PA-C for today's virtual visit.  While this provider is not your primary care provider (PCP), if your PCP is located in our provider database this encounter information will be shared with them immediately following your visit.   A [facility] MyChart account gives you access to today's visit and all your visits, tests, and labs performed at [facility]  click here if you don't have a [facility] MyChart account or go to [email or web address]  Consent: (Patient) Cathy [facility] provided verbal consent for this virtual visit at the beginning of the encounter.  Current Medications:  Current Outpatient Medications:    ondansetron  (ZOFRAN -ODT) 4 MG disintegrating tablet, Take 1-2 tablets (4-8 mg total) by mouth every 8 (eight) hours as needed., Disp: 20 tablet, Rfl: 0   acetaminophen  (TYLENOL ) 500 MG tablet, Take 500 mg by mouth every 6 (six) hours as needed for mild pain., Disp: , Rfl:    levonorgestrel (LILETTA) 19.5 MCG/DAY IUD IUD, 1 each by Intrauterine route once. Placed in beginning of 2021 (Patient not taking: Reported on 10/14/2021), Disp: , Rfl:    sertraline  (ZOLOFT ) 50 MG tablet, Take 1 tablet (50 mg total) by mouth daily. (Patient not taking: Reported on 10/18/2020), Disp: 30 tablet, Rfl: 11   Medications ordered in this encounter:  Meds ordered this encounter  Medications   ondansetron  (ZOFRAN -ODT) 4 MG disintegrating tablet    Sig: Take 1-2 tablets (4-8 mg total) by mouth every 8 (eight) hours as needed.    Dispense:  20 tablet    Refill:  0    Supervising Provider:   LAMPTEY, PHILIP O [[id number]]     *If you need refills on other medications prior to your next appointment, please contact your pharmacy*  Follow-Up: Call back or seek an in-person evaluation if the symptoms worsen or if the condition fails to improve as anticipated.  [facility] [phone]  Other Instructions Viral  Gastroenteritis, Adult  Viral gastroenteritis is also known as the stomach flu. This condition may affect your stomach, small intestine, and large intestine. It can cause sudden watery diarrhea, fever, and vomiting. This condition is caused by many different viruses. These viruses can be passed from person to person very easily (are contagious). Diarrhea and vomiting can make you feel weak and cause you to become dehydrated. You may not be able to keep fluids down. Dehydration can make you tired and thirsty, cause you to have a dry mouth, and decrease how often you urinate. It is important to replace the fluids that you lose from diarrhea and vomiting. What are the causes? Gastroenteritis is caused by many viruses, including rotavirus and norovirus. Norovirus is the most common cause in adults. You can get sick after being exposed to the viruses from other people. You can also get sick by: Eating food, drinking water, or touching a surface contaminated with one of these viruses. Sharing utensils or other personal items with an infected person. What increases the risk? You are more likely to develop this condition if you: Have a weak body defense system (immune system). Live with one or more children who are younger than 2 years. Live in a nursing home. Travel on cruise ships. What are the signs or symptoms? Symptoms of this condition start suddenly 1-3 days after exposure to a virus. Symptoms may last for a few days or for as long as a week. Common symptoms include watery diarrhea and vomiting. Other symptoms  include: Fever. Headache. Fatigue. Pain in the abdomen. Chills. Weakness. Nausea. Muscle aches. Loss of appetite. How is this diagnosed? This condition is diagnosed with a medical history and physical exam. You may also have a stool test to check for viruses or other infections. How is this treated? This condition typically goes away on its own. The focus of treatment is to prevent  dehydration and restore lost fluids (rehydration). This condition may be treated with: An oral rehydration solution (ORS) to replace important salts and minerals (electrolytes) in your body. Take this if told by your health care provider. This is a drink that is sold at pharmacies and retail stores. Medicines to help with your symptoms. Probiotic supplements to reduce symptoms of diarrhea. Fluids given through an IV, if dehydration is severe. Older adults and people with other diseases or a weak immune system are at higher risk for dehydration. Follow these instructions at home: Eating and drinking  Take an ORS as told by your health care provider. Drink clear fluids in small amounts as you are able. Clear fluids include: Water. Ice chips. Diluted fruit juice. Low-calorie sports drinks. Drink enough fluid to keep your urine pale yellow. Eat small amounts of healthy foods every 3-4 hours as you are able. This may include whole grains, fruits, vegetables, lean meats, and yogurt. Avoid fluids that contain a lot of sugar or caffeine, such as energy drinks, sports drinks, and soda. Avoid spicy or fatty foods. Avoid alcohol. General instructions  Wash your hands often, especially after having diarrhea or vomiting. If soap and water are not available, use hand sanitizer. Make sure that all people in your household wash their hands well and often. Take over-the-counter and prescription medicines only as told by your health care provider. Rest at home while you recover. Watch your condition for any changes. Take a warm bath to relieve any burning or pain from frequent diarrhea episodes. Keep all follow-up visits. This is important. Contact a health care provider if you: Cannot keep fluids down. Have symptoms that get worse. Have new symptoms. Feel light-headed or dizzy. Have muscle cramps. Get help right away if you: Have chest pain. Have trouble breathing or you are breathing very  quickly. Have a fast heartbeat. Feel extremely weak or you faint. Have a severe headache, a stiff neck, or both. Have a rash. Have severe pain, cramping, or bloating in your abdomen. Have skin that feels cold and clammy. Feel confused. Have pain when you urinate. Have signs of dehydration, such as: Dark urine, very little urine, or no urine. Cracked lips. Dry mouth. Sunken eyes. Sleepiness. Weakness. Have signs of bleeding, such as: Seeing blood in your vomit. Having vomit that looks like coffee grounds. Having bloody or black stools or stools that look like tar. These symptoms may be an emergency. Get help right away. Call 911. Do not wait to see if the symptoms will go away. Do not drive yourself to the hospital. Summary Viral gastroenteritis is also known as the stomach flu. It can cause sudden watery diarrhea, fever, and vomiting. This condition can be passed from person to person very easily (is contagious). Take an oral rehydration solution (ORS) if told by your health care provider. This is a drink that is sold at pharmacies and retail stores. Wash your hands often, especially after having diarrhea or vomiting. If soap and water are not available, use hand sanitizer. This information is not intended to replace advice given to you by your health care provider. Make  sure you discuss any questions you have with your health care provider. Document Revised: 07/23/2021 Document Reviewed: 07/23/2021 Elsevier Patient Education  2024 Elsevier Inc.   If you have been instructed to have an in-person evaluation today at a local Urgent Care facility, please use the link below. It will take you to a list of all of our available [facility], including address, phone number and hours of operation. Please do not delay care.  [facility]  If you or a family member do not have a primary care provider, use the link below to schedule a visit and establish care. When  you choose a [facility] primary care physician or advanced practice provider, you gain a long-term partner in health. Find a Primary Care Provider  Learn more about [facility] in-office and virtual care options: [facility] - Get Care Now

## 2024-03-22 NOTE — Progress Notes (Signed)
 Virtual Visit Consent   Cathy [facility], you are scheduled for a virtual visit with a [facility] provider today. Just as with appointments in the office, your consent must be obtained to participate. Your consent will be active for this visit and any virtual visit you may have with one of our providers in the next 365 days. If you have a MyChart account, a copy of this consent can be sent to you electronically.  As this is a virtual visit, video technology does not allow for your provider to perform a traditional examination. This may limit your provider's ability to fully assess your condition. If your provider identifies any concerns that need to be evaluated in person or the need to arrange testing (such as labs, EKG, etc.), we will make arrangements to do so. Although advances in technology are sophisticated, we cannot ensure that it will always work on either your end or our end. If the connection with a video visit is poor, the visit may have to be switched to a telephone visit. With either a video or telephone visit, we are not always able to ensure that we have a secure connection.  By engaging in this virtual visit, you consent to the provision of healthcare and authorize for your insurance to be billed (if applicable) for the services provided during this visit. Depending on your insurance coverage, you may receive a charge related to this service.  I need to obtain your verbal consent now. Are you willing to proceed with your visit today? Marquisha GLENDA SPELMAN has provided verbal consent on 03/22/2024 for a virtual visit (video or telephone). Angelia Kelp, PA-C  Date: 03/22/2024 5:26 PM   Virtual Visit via Video Note   I, Angelia Kelp, connected with  Cathy [facility]  ([id number], 23-Jul-1991) on 03/22/24 at  5:15 PM EDT by a video-enabled telemedicine application and verified that I am speaking with the correct person using two identifiers.  Location: Patient: Virtual Visit  Location Patient: Home Provider: Virtual Visit Location Provider: Home Office   I discussed the limitations of evaluation and management by telemedicine and the availability of in person appointments. The patient expressed understanding and agreed to proceed.    History of Present Illness: Cathy [facility] is a 33 y.o. who identifies as a female who was assigned female at birth, and is being seen today for nausea and vomiting.  HPI: Emesis  This is a new problem. The current episode started today (in the middle of the night around 3 am). The problem occurs 5 to 10 times per day. The problem has been gradually worsening. The emesis has an appearance of stomach contents. There has been no fever. Associated symptoms include abdominal pain, chills, diarrhea (2 episodes), headaches and myalgias (back pain and pain on both sides). Pertinent negatives include no fever, sweats or URI. Associated symptoms comments: lightheadedness. She has tried increased fluids for the symptoms. The treatment provided no relief.     Problems:  Patient Active Problem List   Diagnosis Date Noted   Vaginal bleeding affecting early pregnancy 09/14/2019   Preterm delivery 09/14/2019    Allergies: No Known Allergies Medications:  Current Outpatient Medications:    ondansetron  (ZOFRAN -ODT) 4 MG disintegrating tablet, Take 1-2 tablets (4-8 mg total) by mouth every 8 (eight) hours as needed., Disp: 20 tablet, Rfl: 0   acetaminophen  (TYLENOL ) 500 MG tablet, Take 500 mg by mouth every 6 (six) hours as needed for mild pain., Disp: , Rfl:  levonorgestrel (LILETTA) 19.5 MCG/DAY IUD IUD, 1 each by Intrauterine route once. Placed in beginning of 2021 (Patient not taking: Reported on 10/14/2021), Disp: , Rfl:    sertraline  (ZOLOFT ) 50 MG tablet, Take 1 tablet (50 mg total) by mouth daily. (Patient not taking: Reported on 10/18/2020), Disp: 30 tablet, Rfl: 11  Observations/Objective: Patient is well-developed, well-nourished in no  acute distress.  Resting comfortably at home.  Head is normocephalic, atraumatic.  No labored breathing.  Speech is clear and coherent with logical content.  Patient is alert and oriented at baseline.    Assessment and Plan: 1. Viral gastroenteritis (Primary) - ondansetron  (ZOFRAN -ODT) 4 MG disintegrating tablet; Take 1-2 tablets (4-8 mg total) by mouth every 8 (eight) hours as needed.  Dispense: 20 tablet; Refill: 0  - Suspect viral gastroenteritis - Zofran  for nausea - Push fluids, electrolyte beverages - Liquid diet, then increase to soft/bland (BRAT) diet over next day, then increase diet as tolerated - Work note provided - Seek in person evaluation if not improving or symptoms worsen   Follow Up Instructions: I discussed the assessment and treatment plan with the patient. The patient was provided an opportunity to ask questions and all were answered. The patient agreed with the plan and demonstrated an understanding of the instructions.  A copy of instructions were sent to the patient via MyChart unless otherwise noted below.    The patient was advised to call back or seek an in-person evaluation if the symptoms worsen or if the condition fails to improve as anticipated.    Angelia Kelp, PA-C

## 2024-03-23 DIAGNOSIS — F331 Major depressive disorder, recurrent, moderate: Secondary | ICD-10-CM | POA: Diagnosis not present

## 2024-03-27 DIAGNOSIS — F331 Major depressive disorder, recurrent, moderate: Secondary | ICD-10-CM | POA: Diagnosis not present

## 2024-03-28 DIAGNOSIS — F331 Major depressive disorder, recurrent, moderate: Secondary | ICD-10-CM | POA: Diagnosis not present

## 2024-03-30 DIAGNOSIS — F331 Major depressive disorder, recurrent, moderate: Secondary | ICD-10-CM | POA: Diagnosis not present

## 2024-04-07 ENCOUNTER — Telehealth: Admitting: Emergency Medicine

## 2024-04-07 ENCOUNTER — Telehealth: Admitting: Physician Assistant

## 2024-04-07 DIAGNOSIS — K2900 Acute gastritis without bleeding: Secondary | ICD-10-CM | POA: Diagnosis not present

## 2024-04-07 DIAGNOSIS — R109 Unspecified abdominal pain: Secondary | ICD-10-CM

## 2024-04-07 MED ORDER — FAMOTIDINE 20 MG PO TABS: 20.0000 mg | ORAL_TABLET | Freq: Two times a day (BID) | ORAL | 0 refills | Status: AC

## 2024-04-07 NOTE — Progress Notes (Signed)
 Virtual Visit Consent   Cathy [facility], you are scheduled for a virtual visit with a  provider today. Just as with appointments in the office, your consent must be obtained to participate. Your consent will be active for this visit and any virtual visit you may have with one of our providers in the next 365 days. If you have a MyChart account, a copy of this consent can be sent to you electronically.  As this is a virtual visit, video technology does not allow for your provider to perform a traditional examination. This may limit your provider's ability to fully assess your condition. If your provider identifies any concerns that need to be evaluated in person or the need to arrange testing (such as labs, EKG, etc.), we will make arrangements to do so. Although advances in technology are sophisticated, we cannot ensure that it will always work on either your end or our end. If the connection with a video visit is poor, the visit may have to be switched to a telephone visit. With either a video or telephone visit, we are not always able to ensure that we have a secure connection.  By engaging in this virtual visit, you consent to the provision of healthcare and authorize for your insurance to be billed (if applicable) for the services provided during this visit. Depending on your insurance coverage, you may receive a charge related to this service.  I need to obtain your verbal consent now. Are you willing to proceed with your visit today? Cathy [facility] has provided verbal consent on 04/07/2024 for a virtual visit (video or telephone). Lamar Schlossman, PA-C  Date: 04/07/2024 10:28 AM   Virtual Visit via Video Note   I, Lamar Schlossman, connected with  Cathy [facility]  ([id number], [birth year]) on 04/07/24 at 10:15 AM EDT by a video-enabled telemedicine application and verified that I am speaking with the correct person using two identifiers.  Location: Patient: Virtual Visit Location  Patient: Home Provider: Virtual Visit Location Provider: Home Office   I discussed the limitations of evaluation and management by telemedicine and the availability of in person appointments. The patient expressed understanding and agreed to proceed.    History of Present Illness: Cathy [facility] is a 33 y.o. who identifies as a female who was assigned female at birth, and is being seen today for abdominal pain.  Reports history of diverticulitis. Reports pain in the upper and lower abdomen.  States that the pain returned yesterday, or the day before, or a week ago.  States that she isn't really sure.  States that she hasn't had a fever.  Reports associated nausea, vomiting, and diarrhea.  Denies pregnancy or breast feeding.  HPI: HPI  Problems:  Patient Active Problem List   Diagnosis Date Noted   Vaginal bleeding affecting early pregnancy 09/14/2019   Preterm delivery 09/14/2019    Allergies: No Known Allergies Medications:  Current Outpatient Medications:    acetaminophen  (TYLENOL ) 500 MG tablet, Take 500 mg by mouth every 6 (six) hours as needed for mild pain., Disp: , Rfl:    levonorgestrel (LILETTA) 19.5 MCG/DAY IUD IUD, 1 each by Intrauterine route once. Placed in beginning of 2021 (Patient not taking: Reported on 10/14/2021), Disp: , Rfl:    ondansetron  (ZOFRAN -ODT) 4 MG disintegrating tablet, Take 1-2 tablets (4-8 mg total) by mouth every 8 (eight) hours as needed., Disp: 20 tablet, Rfl: 0   sertraline  (ZOLOFT ) 50 MG tablet, Take 1 tablet (50 mg total) by  mouth daily. (Patient not taking: Reported on 10/18/2020), Disp: 30 tablet, Rfl: 11  Observations/Objective: Patient is well-developed, well-nourished in no acute distress.  Resting comfortably at home.  Head is normocephalic, atraumatic.  No labored breathing.  Speech is clear and coherent with logical content.  Patient is alert and oriented at baseline.    Assessment and Plan: 1. Abdominal pain, unspecified abdominal  location (Primary)   Patient here with worsening abdominal pain.  Hx of diverticulitis.  She states that this feels similar and she is concerned about the same.  Requesting work note and pain medications.  Due to hx and concern for diverticulitis, I discussed that due to the worsening pain and my inability to adequately rule out diverticulitis over a video visit, patient will need to be seen in person.  Follow Up Instructions: I discussed the assessment and treatment plan with the patient. The patient was provided an opportunity to ask questions and all were answered. The patient agreed with the plan and demonstrated an understanding of the instructions.  A copy of instructions were sent to the patient via MyChart unless otherwise noted below.     The patient was advised to call back or seek an in-person evaluation if the symptoms worsen or if the condition fails to improve as anticipated.    Lamar Schlossman, PA-C

## 2024-04-07 NOTE — Patient Instructions (Signed)
Based on what you shared with me, I feel your condition warrants further evaluation as soon as possible at an Emergency department.    NOTE: There will be NO CHARGE for this Visit   If you are having a true medical emergency please call 911.      Emergency Department-[facility]  Get Driving Directions  [phone]  [address]  [place], [place] [address]  Open 24/7/365      [facility] [address]  Get Driving Directions  [address]  [place], [place] [address]  Open 24/7/365    Emergency Department- [facility]  Get Driving Directions  [phone]  [address]  [place], [place] [address]  Open 24/7/365      [facility]  Get Driving Directions  [phone]  [address]  [place], [place] [address]  Open 24/7/365    [place]  Emergency Department- [facility]  Get Driving Directions  [phone]  [address]  [place], [place] [address]  Open 24/7/365    [place]  Emergency Department- [facility] [place]  Get Driving Directions  [address]  [place], [place] [address]  Open 24/7/365    [place]  Emergency Department- [facility]  Get Driving Directions  [phone]  [address]  [place], [place] [address]  Open 24/7/365    

## 2024-04-07 NOTE — Progress Notes (Signed)
 Virtual Visit Consent   Cathy Joseph, you are scheduled for a virtual visit with a [facility] provider today. Just as with appointments in the office, your consent must be obtained to participate. Your consent will be active for this visit and any virtual visit you may have with one of our providers in the next 365 days. If you have a MyChart account, a copy of this consent can be sent to you electronically.  As this is a virtual visit, video technology does not allow for your provider to perform a traditional examination. This may limit your provider's ability to fully assess your condition. If your provider identifies any concerns that need to be evaluated in person or the need to arrange testing (such as labs, EKG, etc.), we will make arrangements to do so. Although advances in technology are sophisticated, we cannot ensure that it will always work on either your end or our end. If the connection with a video visit is poor, the visit may have to be switched to a telephone visit. With either a video or telephone visit, we are not always able to ensure that we have a secure connection.  By engaging in this virtual visit, you consent to the provision of healthcare and authorize for your insurance to be billed (if applicable) for the services provided during this visit. Depending on your insurance coverage, you may receive a charge related to this service.  I need to obtain your verbal consent now. Are you willing to proceed with your visit today? Cathy Joseph has provided verbal consent on 04/07/2024 for a virtual visit (video or telephone). Cathy Joseph, [place]  Date: 04/07/2024 12:24 PM   Virtual Visit via Video Note   I, Cathy Joseph, connected with  Cathy Joseph  ([id number], 11/22/1990) on 04/07/24 at 12:00 PM EDT by a video-enabled telemedicine application and verified that I am speaking with the correct person using two identifiers.  Location: Patient: Virtual Visit  Location Patient: Home Provider: Virtual Visit Location Provider: Home Office   I discussed the limitations of evaluation and management by telemedicine and the availability of in person appointments. The patient expressed understanding and agreed to proceed.    History of Present Illness: Cathy Joseph is a 33 y.o. who identifies as a female who was assigned female at birth, and is being seen today for re-evaluation of current symptoms.  Patient was evaluated by our team 2 hours ago for acute stomach symptoms.  Patient was instructed to seek an in person evaluation due to history of diverticulitis.  Patient states she feels the provider did not understand what she was trying to convey with her symptoms.  States this does not feel like her history of diverticulitis at all.  Patient endorses that last night after eating pizza from [facility], she started to notice some nausea and left upper quadrant pain associated with loose stools.  That has continued into this morning with a couple episodes of nonbloody emesis.  Denies fever or chills.  Notes loose stool without frequency.  Denies melena, hematochezia or tenesmus.  Notes sensation of reflux but without overt heartburn.  Has been able to hydrate and rest.  Denies any lower abdominal pain or rectal bleeding.  Patient states transportation is a big issue for her right now so she is not wanting to go to the emergency room unless she absolutely needs to.  HPI: HPI  Problems:  Patient Active Problem List   Diagnosis Date Noted  Vaginal bleeding affecting early pregnancy 09/14/2019   Preterm delivery 09/14/2019    Allergies: No Known Allergies Medications:  Current Outpatient Medications:    famotidine  (PEPCID ) 20 MG tablet, Take 1 tablet (20 mg total) by mouth 2 (two) times daily., Disp: 20 tablet, Rfl: 0   acetaminophen  (TYLENOL ) 500 MG tablet, Take 500 mg by mouth every 6 (six) hours as needed for mild pain., Disp: , Rfl:    levonorgestrel  (LILETTA) 19.5 MCG/DAY IUD IUD, 1 each by Intrauterine route once. Placed in beginning of 2021 (Patient not taking: Reported on 10/14/2021), Disp: , Rfl:    ondansetron  (ZOFRAN -ODT) 4 MG disintegrating tablet, Take 1-2 tablets (4-8 mg total) by mouth every 8 (eight) hours as needed., Disp: 20 tablet, Rfl: 0   sertraline  (ZOLOFT ) 50 MG tablet, Take 1 tablet (50 mg total) by mouth daily. (Patient not taking: Reported on 10/18/2020), Disp: 30 tablet, Rfl: 11  Observations/Objective: Patient is well-developed, well-nourished in no acute distress.  Resting comfortably  at home.  Head is normocephalic, atraumatic.  No labored breathing.  Speech is clear and coherent with logical content.  Patient is alert and oriented at baseline.   Assessment and Plan: 1. Acute gastritis without hemorrhage, unspecified gastritis type (Primary) - famotidine  (PEPCID ) 20 MG tablet; Take 1 tablet (20 mg total) by mouth 2 (two) times daily.  Dispense: 20 tablet; Refill: 0  Based on what was conveyed to me, this seems more consistent with a gastritis and much lower suspicion of a flare of diverticulitis.  Afebrile.  No alarm signs or symptoms present.  Will have her hydrate and rest.  Start brat diet.  She does have Zofran  which she has been encouraged to use as directed for nausea.  Will add on famotidine  20 mg twice daily for the next few days to reduce acid production and allow healing.  She is aware that if she notes any new or worsening symptoms at all despite treatment, she must seek an in person evaluation ASAP.  Follow Up Instructions: I discussed the assessment and treatment plan with the patient. The patient was provided an opportunity to ask questions and all were answered. The patient agreed with the plan and demonstrated an understanding of the instructions.  A copy of instructions were sent to the patient via MyChart unless otherwise noted below.    The patient was advised to call back or seek an in-person  evaluation if the symptoms worsen or if the condition fails to improve as anticipated.    Cathy Velma Lunger, PA-C

## 2024-04-07 NOTE — Patient Instructions (Signed)
  Cathy Joseph, thank you for joining Elsie Velma Lunger, PA-C for today's virtual visit.  While this provider is not your primary care provider (PCP), if your PCP is located in our provider database this encounter information will be shared with them immediately following your visit.   A [facility] MyChart account gives you access to today's visit and all your visits, tests, and labs performed at [facility]  click here if you don't have a [facility] MyChart account or go to [email or web address]  Consent: (Patient) Cathy Joseph provided verbal consent for this virtual visit at the beginning of the encounter.  Current Medications:  Current Outpatient Medications:    famotidine  (PEPCID ) 20 MG tablet, Take 1 tablet (20 mg total) by mouth 2 (two) times daily., Disp: 20 tablet, Rfl: 0   acetaminophen  (TYLENOL ) 500 MG tablet, Take 500 mg by mouth every 6 (six) hours as needed for mild pain., Disp: , Rfl:    levonorgestrel (LILETTA) 19.5 MCG/DAY IUD IUD, 1 each by Intrauterine route once. Placed in beginning of 2021 (Patient not taking: Reported on 10/14/2021), Disp: , Rfl:    ondansetron  (ZOFRAN -ODT) 4 MG disintegrating tablet, Take 1-2 tablets (4-8 mg total) by mouth every 8 (eight) hours as needed., Disp: 20 tablet, Rfl: 0   sertraline  (ZOLOFT ) 50 MG tablet, Take 1 tablet (50 mg total) by mouth daily. (Patient not taking: Reported on 10/18/2020), Disp: 30 tablet, Rfl: 11   Medications ordered in this encounter:  Meds ordered this encounter  Medications   famotidine  (PEPCID ) 20 MG tablet    Sig: Take 1 tablet (20 mg total) by mouth 2 (two) times daily.    Dispense:  20 tablet    Refill:  0    Supervising Provider:   LAMPTEY, PHILIP O [[id number]]     *If you need refills on other medications prior to your next appointment, please contact your pharmacy*  Follow-Up: Call back or seek an in-person evaluation if the symptoms worsen or if the condition fails to improve as  anticipated.  [facility] [phone]  Other Instructions Please hydrate and rest. Follow the dietary recommendations below. Avoid any NSAIDs (Advil , Aleve, aspirin or Goody powders), or alcohol consumption. Okay to use your Zofran  as needed for nausea and to prevent vomiting. Take the famotidine  twice daily as directed until symptoms are resolving. If symptoms are not resolving, or if you note any new or worsening symptoms at all, you must be evaluated in person ASAP.  Do not delay care!   If you have been instructed to have an in-person evaluation today at a local Urgent Care facility, please use the link below. It will take you to a list of all of our available [facility], including address, phone number and hours of operation. Please do not delay care.  [facility]  If you or a family member do not have a primary care provider, use the link below to schedule a visit and establish care. When you choose a [facility] primary care physician or advanced practice provider, you gain a long-term partner in health. Find a Primary Care Provider  Learn more about [facility]'s in-office and virtual care options: [facility] - Get Care Now

## 2024-04-26 ENCOUNTER — Other Ambulatory Visit: Payer: Self-pay

## 2024-04-26 ENCOUNTER — Encounter (HOSPITAL_COMMUNITY): Payer: Self-pay

## 2024-04-26 ENCOUNTER — Emergency Department (HOSPITAL_COMMUNITY)

## 2024-04-26 ENCOUNTER — Emergency Department (HOSPITAL_COMMUNITY)
Admission: EM | Admit: 2024-04-26 | Discharge: 2024-04-26 | Disposition: A | Discharge status: Home / Self Care | Attending: Emergency Medicine | Admitting: Emergency Medicine

## 2024-04-26 DIAGNOSIS — R1084 Generalized abdominal pain: Secondary | ICD-10-CM | POA: Insufficient documentation

## 2024-04-26 DIAGNOSIS — R1030 Lower abdominal pain, unspecified: Secondary | ICD-10-CM | POA: Diagnosis not present

## 2024-04-26 DIAGNOSIS — R109 Unspecified abdominal pain: Secondary | ICD-10-CM | POA: Diagnosis not present

## 2024-04-26 DIAGNOSIS — K449 Diaphragmatic hernia without obstruction or gangrene: Secondary | ICD-10-CM | POA: Diagnosis not present

## 2024-04-26 DIAGNOSIS — K5730 Diverticulosis of large intestine without perforation or abscess without bleeding: Secondary | ICD-10-CM | POA: Diagnosis not present

## 2024-04-26 LAB — COMPREHENSIVE METABOLIC PANEL WITH GFR
ALT: 9 U/L (ref 0–44)
AST: 17 U/L (ref 15–41)
Albumin: 3.2 g/dL — ABNORMAL LOW (ref 3.5–5.0)
Alkaline Phosphatase: 61 U/L (ref 38–126)
Anion gap: 7 (ref 5–15)
BUN: 12 mg/dL (ref 6–20)
CO2: 23 mmol/L (ref 22–32)
Calcium: 8.8 mg/dL — ABNORMAL LOW (ref 8.9–10.3)
Chloride: 109 mmol/L (ref 98–111)
Creatinine, Ser: 1.07 mg/dL — ABNORMAL HIGH (ref 0.44–1.00)
GFR, Estimated: >60 mL/min (ref >60)
Glucose, Bld: 92 mg/dL (ref 70–99)
Potassium: 4.3 mmol/L (ref 3.5–5.1)
Sodium: 139 mmol/L (ref 135–145)
Total Bilirubin: 0.6 mg/dL (ref 0.0–1.2)
Total Protein: 6.2 g/dL — ABNORMAL LOW (ref 6.5–8.1)

## 2024-04-26 LAB — CBC
HCT: 36.4 % (ref 36.0–46.0)
Hemoglobin: 11.4 g/dL — ABNORMAL LOW (ref 12.0–15.0)
MCH: 28.6 pg (ref 26.0–34.0)
MCHC: 31.3 g/dL (ref 30.0–36.0)
MCV: 91.5 fL (ref 80.0–100.0)
Platelets: 210 K/uL (ref 150–400)
RBC: 3.98 MIL/uL (ref 3.87–5.11)
RDW: 12.4 % (ref 11.5–15.5)
WBC: 4.7 K/uL (ref 4.0–10.5)
nRBC: 0 % (ref 0.0–0.2)

## 2024-04-26 LAB — URINALYSIS, ROUTINE W REFLEX MICROSCOPIC
Bilirubin Urine: NEGATIVE
Glucose, UA: NEGATIVE mg/dL
Hgb urine dipstick: NEGATIVE
Ketones, ur: NEGATIVE mg/dL
Nitrite: NEGATIVE
Protein, ur: NEGATIVE mg/dL
Specific Gravity, Urine: 1.019 (ref 1.005–1.030)
pH: 7 (ref 5.0–8.0)

## 2024-04-26 LAB — HCG, SERUM, QUALITATIVE: Preg, Serum: NEGATIVE

## 2024-04-26 LAB — LIPASE, BLOOD: Lipase: 28 U/L (ref 11–51)

## 2024-04-26 MED ORDER — IOHEXOL 350 MG/ML SOLN
75.0000 mL | Freq: Once | INTRAVENOUS | Status: AC | PRN
  Administered 2024-04-26: 75 mL via INTRAVENOUS

## 2024-04-26 MED ORDER — ONDANSETRON 4 MG PO TBDP
4.0000 mg | ORAL_TABLET | Freq: Once | ORAL | Status: AC | PRN
  Administered 2024-04-26: 4 mg via ORAL
  Filled 2024-04-26: qty 1

## 2024-04-26 MED ORDER — NITROFURANTOIN MONOHYD MACRO 100 MG PO CAPS: 100.0000 mg | ORAL_CAPSULE | Freq: Two times a day (BID) | ORAL | 0 refills | Status: AC

## 2024-04-26 MED ORDER — METOCLOPRAMIDE HCL 5 MG/ML IJ SOLN
5.0000 mg | Freq: Once | INTRAMUSCULAR | Status: AC
  Administered 2024-04-26: 5 mg via INTRAVENOUS
  Filled 2024-04-26: qty 2

## 2024-04-26 MED ORDER — DIPHENHYDRAMINE HCL 50 MG/ML IJ SOLN
25.0000 mg | Freq: Once | INTRAMUSCULAR | Status: AC
  Administered 2024-04-26: 25 mg via INTRAVENOUS
  Filled 2024-04-26: qty 1

## 2024-04-26 NOTE — ED Notes (Signed)
 Patient transported to CT

## 2024-04-26 NOTE — ED Provider Notes (Signed)
 [place] EMERGENCY DEPARTMENT AT [facility] Provider Note   CSN: [id number] Arrival date & time: 04/26/24  9081     Patient presents with: Abdominal Pain   Cathy Joseph is a 33 y.o. female with no pertinent medical history presented to ED for ongoing, worsening abdominal pain. States it has been ongoing for one month and describes an apron like distribution of her pain that is sharp in nature. It intermittently comes and goes, and does not alleviate with Tylenol , Zofran , or NSAIDs. Reports nausea, vomiting, dysuria, urgency, frequency, and diarrhea. She has had the same sexual partner and denies prior STD infection. LMP 7/1.     Prior to Admission medications   Medication Sig Start Date End Date Taking? Authorizing Provider  acetaminophen  (TYLENOL ) 500 MG tablet Take 500 mg by mouth every 6 (six) hours as needed for mild pain.    [provider]  famotidine  (PEPCID ) 20 MG tablet Take 1 tablet (20 mg total) by mouth 2 (two) times daily. 04/07/24   Gladis Elsie BROCKS, PA-C  levonorgestrel (LILETTA) 19.5 MCG/DAY IUD IUD 1 each by Intrauterine route once. Placed in beginning of 2021 Patient not taking: Reported on 10/14/2021 12/14/19   [provider]  ondansetron  (ZOFRAN -ODT) 4 MG disintegrating tablet Take 1-2 tablets (4-8 mg total) by mouth every 8 (eight) hours as needed. 03/22/24   Vivienne Delon HERO, PA-C  sertraline  (ZOLOFT ) 50 MG tablet Take 1 tablet (50 mg total) by mouth daily. Patient not taking: Reported on 10/18/2020 09/15/19   Ward, Mitzie BROCKS, MD  promethazine  (PHENERGAN ) 25 MG tablet Take 1 tablet (25 mg total) by mouth every 6 (six) hours as needed for nausea or vomiting. 06/15/18 09/12/20  Dorothyann Drivers, MD    Allergies: Patient has no known allergies.    Review of Systems  Cardiovascular:  Positive for chest pain.  Gastrointestinal:  Positive for abdominal pain, diarrhea, nausea and vomiting.  Genitourinary:  Positive for dysuria, frequency  and urgency.  All other systems reviewed and are negative.   Updated Vital Signs BP 119/83   Pulse (!) 52   Temp 97.8 F (36.6 C)   Resp 17   Ht 5' 4 (1.626 m)   Wt 113.4 kg   LMP 04/06/2024 (Approximate)   SpO2 100%   BMI 42.91 kg/m   Physical Exam HENT:     Head: Normocephalic and atraumatic.  Eyes:     Extraocular Movements: Extraocular movements intact.     Pupils: Pupils are equal, round, and reactive to light.  Cardiovascular:     Rate and Rhythm: Normal rate and regular rhythm.  Pulmonary:     Effort: Pulmonary effort is normal.     Breath sounds: Normal breath sounds.  Abdominal:     General: Bowel sounds are increased.     Palpations: Abdomen is soft.     Tenderness: There is generalized abdominal tenderness.  Neurological:     General: No focal deficit present.     Mental Status: She is alert and oriented to person, place, and time.  Psychiatric:        Mood and Affect: Mood normal.        Behavior: Behavior normal.     (all labs ordered are listed, but only abnormal results are displayed) Labs Reviewed  COMPREHENSIVE METABOLIC PANEL WITH GFR - Abnormal; Notable for the following components:      Result Value   Creatinine, Ser 1.07 (*)    Calcium 8.8 (*)  Total Protein 6.2 (*)    Albumin 3.2 (*)    All other components within normal limits  CBC - Abnormal; Notable for the following components:   Hemoglobin 11.4 (*)    All other components within normal limits  URINALYSIS, ROUTINE W REFLEX MICROSCOPIC - Abnormal; Notable for the following components:   APPearance HAZY (*)    Leukocytes,Ua TRACE (*)    Bacteria, UA RARE (*)    All other components within normal limits  LIPASE, BLOOD  HCG, SERUM, QUALITATIVE  GC/CHLAMYDIA PROBE AMP ([place]) NOT AT [facility]    EKG: None  Radiology: No results found.   Procedures   Medications Ordered in the ED  ondansetron  (ZOFRAN -ODT) disintegrating tablet 4 mg (4 mg Oral Given 04/26/24 0932)                                     Medical Decision Making Amount and/or Complexity of Data Reviewed Labs: ordered. Radiology: ordered.  Risk Prescription drug management.       Final diagnoses:  None    ED Discharge Orders     None          Fabianna Keats, DO 04/26/24 1401    Dasie Faden, MD 04/27/24 [phone]

## 2024-04-26 NOTE — Discharge Instructions (Addendum)
 You were found to have a urinary tract infection in the ED today, which may be contributing to the symptoms you have been feeling. We also did a pregnancy test, which was negative. Your CT scan was negative for any signs of acute infection. I will be sending you home with some antibiotics to take called nitrofuratonin 100mg , or Macrobid . Please take this with food and water twice a day for 5 days. If you start to develop any chest pain, high fever, or confusion, please return to the ED.

## 2024-04-26 NOTE — ED Provider Notes (Addendum)
 I saw and evaluated the patient, reviewed the resident's note and I agree with the findings and plan.  33 year old female presents with abdominal discomfort for about a month.  States that the pain starts at bilateral anterior upper quadrant and goes to the upper belly.  No associated fever or chills.  She does use marijuana.  Abdominal exam is benign.  Will do labs and CT and reassess       Cathy Faden, MD 04/26/24 1101    Cathy Faden, MD 04/26/24 1102

## 2024-04-26 NOTE — ED Triage Notes (Signed)
 C/o lower abd. Pain  onset 4 weeks states its worse today , c/o sharp stabbing pain.

## 2024-04-27 LAB — GC/CHLAMYDIA PROBE AMP (~~LOC~~) NOT AT ARMC
Chlamydia: NEGATIVE
Comment: NEGATIVE
Comment: NORMAL
Neisseria Gonorrhea: NEGATIVE

## 2024-05-08 DIAGNOSIS — F331 Major depressive disorder, recurrent, moderate: Secondary | ICD-10-CM | POA: Diagnosis not present

## 2024-05-19 DIAGNOSIS — K449 Diaphragmatic hernia without obstruction or gangrene: Secondary | ICD-10-CM | POA: Diagnosis not present

## 2024-05-19 DIAGNOSIS — K5790 Diverticulosis of intestine, part unspecified, without perforation or abscess without bleeding: Secondary | ICD-10-CM | POA: Diagnosis not present

## 2024-05-19 DIAGNOSIS — Z1389 Encounter for screening for other disorder: Secondary | ICD-10-CM | POA: Diagnosis not present

## 2024-05-19 DIAGNOSIS — R1084 Generalized abdominal pain: Secondary | ICD-10-CM | POA: Diagnosis not present

## 2024-05-24 ENCOUNTER — Telehealth: Admitting: Physician Assistant

## 2024-05-24 ENCOUNTER — Telehealth

## 2024-05-24 DIAGNOSIS — R112 Nausea with vomiting, unspecified: Secondary | ICD-10-CM

## 2024-05-24 DIAGNOSIS — F331 Major depressive disorder, recurrent, moderate: Secondary | ICD-10-CM | POA: Diagnosis not present

## 2024-05-24 MED ORDER — ONDANSETRON 4 MG PO TBDP: 4.0000 mg | ORAL_TABLET | Freq: Three times a day (TID) | ORAL | 0 refills | Status: AC | PRN

## 2024-05-24 NOTE — Patient Instructions (Signed)
  Nola MARLA Molly, thank you for joining Teena Shuck, PA-C for today's virtual visit.  While this provider is not your primary care provider (PCP), if your PCP is located in our provider database this encounter information will be shared with them immediately following your visit.   A [facility] MyChart account gives you access to today's visit and all your visits, tests, and labs performed at [facility]  click here if you don't have a [facility] MyChart account or go to [email or web address]  Consent: (Patient) Nola MARLA Molly provided verbal consent for this virtual visit at the beginning of the encounter.  Current Medications:  Current Outpatient Medications:    acetaminophen  (TYLENOL ) 500 MG tablet, Take 500 mg by mouth every 6 (six) hours as needed for mild pain., Disp: , Rfl:    famotidine  (PEPCID ) 20 MG tablet, Take 1 tablet (20 mg total) by mouth 2 (two) times daily., Disp: 20 tablet, Rfl: 0   levonorgestrel (LILETTA) 19.5 MCG/DAY IUD IUD, 1 each by Intrauterine route once. Placed in beginning of 2021 (Patient not taking: Reported on 10/14/2021), Disp: , Rfl:    nitrofurantoin , macrocrystal-monohydrate, (MACROBID ) 100 MG capsule, Take 1 capsule (100 mg total) by mouth 2 (two) times daily., Disp: 10 capsule, Rfl: 0   ondansetron  (ZOFRAN -ODT) 4 MG disintegrating tablet, Take 1-2 tablets (4-8 mg total) by mouth every 8 (eight) hours as needed., Disp: 20 tablet, Rfl: 0   sertraline  (ZOLOFT ) 50 MG tablet, Take 1 tablet (50 mg total) by mouth daily. (Patient not taking: Reported on 10/18/2020), Disp: 30 tablet, Rfl: 11   Medications ordered in this encounter:  No orders of the defined types were placed in this encounter.    *If you need refills on other medications prior to your next appointment, please contact your pharmacy*  Follow-Up: Call back or seek an in-person evaluation if the symptoms worsen or if the condition fails to improve as anticipated.  [facility] [phone]  Other Instructions Please report to the nearest Emergency room with any worsening symptoms. Follow up with primary care provider (PCP) in 2 -3 days.    If you have been instructed to have an in-person evaluation today at a local Urgent Care facility, please use the link below. It will take you to a list of all of our available [facility], including address, phone number and hours of operation. Please do not delay care.  [facility]  If you or a family member do not have a primary care provider, use the link below to schedule a visit and establish care. When you choose a [facility] primary care physician or advanced practice provider, you gain a long-term partner in health. Find a Primary Care Provider  Learn more about [facility]'s in-office and virtual care options:  - Get Care Now

## 2024-05-24 NOTE — Progress Notes (Signed)
 Virtual Visit Consent   Cathy Joseph, you are scheduled for a virtual visit with a [facility] provider today. Just as with appointments in the office, your consent must be obtained to participate. Your consent will be active for this visit and any virtual visit you may have with one of our providers in the next 365 days. If you have a MyChart account, a copy of this consent can be sent to you electronically.  As this is a virtual visit, video technology does not allow for your provider to perform a traditional examination. This may limit your provider's ability to fully assess your condition. If your provider identifies any concerns that need to be evaluated in person or the need to arrange testing (such as labs, EKG, etc.), we will make arrangements to do so. Although advances in technology are sophisticated, we cannot ensure that it will always work on either your end or our end. If the connection with a video visit is poor, the visit may have to be switched to a telephone visit. With either a video or telephone visit, we are not always able to ensure that we have a secure connection.  By engaging in this virtual visit, you consent to the provision of healthcare and authorize for your insurance to be billed (if applicable) for the services provided during this visit. Depending on your insurance coverage, you may receive a charge related to this service.  I need to obtain your verbal consent now. Are you willing to proceed with your visit today? Marguita FELICITAS SINE has provided verbal consent on 05/24/2024 for a virtual visit (video or telephone). Teena Shuck, [place]  Date: 05/24/2024 3:04 PM   Virtual Visit via Video Note   I, Teena Shuck, connected with  KARLA PAVONE  ([id number], January 23, 1991) on 05/24/24 at  3:00 PM EDT by a video-enabled telemedicine application and verified that I am speaking with the correct person using two identifiers.  Location: Patient: Virtual Visit Location Patient:  Home Provider: Virtual Visit Location Provider: Home Office   I discussed the limitations of evaluation and management by telemedicine and the availability of in person appointments. The patient expressed understanding and agreed to proceed.    History of Present Illness: Cathy Joseph is a 33 y.o. who identifies as a female who was assigned female at birth, and is being seen today for nausea and backpain.  HPI: Emesis  This is a new problem. The current episode started today. The problem occurs 2 to 4 times per day. The problem has been unchanged. The emesis has an appearance of stomach contents. There has been no fever. The treatment provided no relief.    Problems:  Patient Active Problem List   Diagnosis Date Noted   Vaginal bleeding affecting early pregnancy 09/14/2019   Preterm delivery 09/14/2019    Allergies: No Known Allergies Medications:  Current Outpatient Medications:    acetaminophen  (TYLENOL ) 500 MG tablet, Take 500 mg by mouth every 6 (six) hours as needed for mild pain., Disp: , Rfl:    famotidine  (PEPCID ) 20 MG tablet, Take 1 tablet (20 mg total) by mouth 2 (two) times daily., Disp: 20 tablet, Rfl: 0   levonorgestrel (LILETTA) 19.5 MCG/DAY IUD IUD, 1 each by Intrauterine route once. Placed in beginning of 2021 (Patient not taking: Reported on 10/14/2021), Disp: , Rfl:    nitrofurantoin , macrocrystal-monohydrate, (MACROBID ) 100 MG capsule, Take 1 capsule (100 mg total) by mouth 2 (two) times daily., Disp: 10 capsule, Rfl: 0  ondansetron  (ZOFRAN -ODT) 4 MG disintegrating tablet, Take 1-2 tablets (4-8 mg total) by mouth every 8 (eight) hours as needed., Disp: 20 tablet, Rfl: 0   sertraline  (ZOLOFT ) 50 MG tablet, Take 1 tablet (50 mg total) by mouth daily. (Patient not taking: Reported on 10/18/2020), Disp: 30 tablet, Rfl: 11  Observations/Objective: Patient is well-developed, well-nourished in no acute distress.  Resting comfortably  at home.  Head is normocephalic,  atraumatic.  No labored breathing.  Speech is clear and coherent with logical content.  Patient is alert and oriented at baseline.    Assessment and Plan: 1. Nausea and vomiting, unspecified vomiting type (Primary)   Patient presenting for evaluation of nausea and vomiting. No fever, no hypoxia, no tachypnea, no hypotension.  Diagnosis at this time most consistent nausea and vomiting - suspected food source.  Differential diagnosis considered including diarrhea, viral infection, infectious diarrhea, antibiotic associated diarrhea, dehydration, appendicitis, inflammatory bowel disease.  These were thought to be less likely given the history and exam  Rest, fluids, and over-the-counter symptomatic treatments were recommended.  Recommend over the counter Tylenol  and Ibuprofen  for fever/general discomfort.  Monitor for new or worsening symptoms.  Follow Up Instructions: I discussed the assessment and treatment plan with the patient. The patient was provided an opportunity to ask questions and all were answered. The patient agreed with the plan and demonstrated an understanding of the instructions.  A copy of instructions were sent to the patient via MyChart unless otherwise noted below.    The patient was advised to call back or seek an in-person evaluation if the symptoms worsen or if the condition fails to improve as anticipated.    Teena Shuck, PA-C

## 2024-05-27 DIAGNOSIS — F331 Major depressive disorder, recurrent, moderate: Secondary | ICD-10-CM | POA: Diagnosis not present

## 2024-06-11 DIAGNOSIS — F331 Major depressive disorder, recurrent, moderate: Secondary | ICD-10-CM | POA: Diagnosis not present

## 2024-06-15 DIAGNOSIS — F331 Major depressive disorder, recurrent, moderate: Secondary | ICD-10-CM | POA: Diagnosis not present

## 2024-06-30 DIAGNOSIS — F331 Major depressive disorder, recurrent, moderate: Secondary | ICD-10-CM | POA: Diagnosis not present

## 2024-07-05 ENCOUNTER — Ambulatory Visit

## 2024-07-05 ENCOUNTER — Telehealth: Admitting: Family Medicine

## 2024-07-05 ENCOUNTER — Telehealth: Admitting: Physician Assistant

## 2024-07-05 DIAGNOSIS — M545 Low back pain, unspecified: Secondary | ICD-10-CM | POA: Diagnosis not present

## 2024-07-05 MED ORDER — IBUPROFEN 800 MG PO TABS: 800.0000 mg | ORAL_TABLET | Freq: Three times a day (TID) | ORAL | 0 refills | Status: DC | PRN

## 2024-07-05 MED ORDER — CYCLOBENZAPRINE HCL 10 MG PO TABS: 5.0000 mg | ORAL_TABLET | Freq: Three times a day (TID) | ORAL | 0 refills | Status: AC | PRN
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
## 2024-07-05 NOTE — Progress Notes (Signed)
 The patient no-showed for appointment despite this provider sending direct link, reaching out via phone with no response and waiting for at least 10 minutes from appointment time for patient to join. They will be marked as a NS for this appointment/time.   Freddy Finner, NP

## 2024-07-05 NOTE — Patient Instructions (Signed)
 Cathy Joseph, thank you for joining Delon CHRISTELLA Dickinson, PA-C for today's virtual visit.  While this provider is not your primary care provider (PCP), if your PCP is located in our provider database this encounter information will be shared with them immediately following your visit.   A [facility] MyChart account gives you access to today's visit and all your visits, tests, and labs performed at [facility]  click here if you don't have a [facility] MyChart account or go to [email or web address]  Consent: (Patient) Cathy Joseph provided verbal consent for this virtual visit at the beginning of the encounter.  Current Medications:  Current Outpatient Medications:    cyclobenzaprine (FLEXERIL) 10 MG tablet, Take 0.5-1 tablets (5-10 mg total) by mouth 3 (three) times daily as needed., Disp: 30 tablet, Rfl: 0   ibuprofen  (ADVIL ) 800 MG tablet, Take 1 tablet (800 mg total) by mouth every 8 (eight) hours as needed., Disp: 30 tablet, Rfl: 0   acetaminophen  (TYLENOL ) 500 MG tablet, Take 500 mg by mouth every 6 (six) hours as needed for mild pain., Disp: , Rfl:    famotidine  (PEPCID ) 20 MG tablet, Take 1 tablet (20 mg total) by mouth 2 (two) times daily., Disp: 20 tablet, Rfl: 0   levonorgestrel (LILETTA) 19.5 MCG/DAY IUD IUD, 1 each by Intrauterine route once. Placed in beginning of 2021 (Patient not taking: Reported on 10/14/2021), Disp: , Rfl:    nitrofurantoin , macrocrystal-monohydrate, (MACROBID ) 100 MG capsule, Take 1 capsule (100 mg total) by mouth 2 (two) times daily., Disp: 10 capsule, Rfl: 0   ondansetron  (ZOFRAN -ODT) 4 MG disintegrating tablet, Take 1-2 tablets (4-8 mg total) by mouth every 8 (eight) hours as needed., Disp: 20 tablet, Rfl: 0   sertraline  (ZOLOFT ) 50 MG tablet, Take 1 tablet (50 mg total) by mouth daily. (Patient not taking: Reported on 10/18/2020), Disp: 30 tablet, Rfl: 11   Medications ordered in this encounter:  Meds ordered this encounter   Medications   cyclobenzaprine (FLEXERIL) 10 MG tablet    Sig: Take 0.5-1 tablets (5-10 mg total) by mouth 3 (three) times daily as needed.    Dispense:  30 tablet    Refill:  0    Supervising Provider:   LAMPTEY, PHILIP O [[id number]]   ibuprofen  (ADVIL ) 800 MG tablet    Sig: Take 1 tablet (800 mg total) by mouth every 8 (eight) hours as needed.    Dispense:  30 tablet    Refill:  0    Supervising Provider:   LAMPTEY, PHILIP O [[id number]]     *If you need refills on other medications prior to your next appointment, please contact your pharmacy*  Follow-Up: Call back or seek an in-person evaluation if the symptoms worsen or if the condition fails to improve as anticipated.  [facility] [phone]  Other Instructions  Back Exercises The following exercises strengthen the muscles that help to support the trunk (torso) and back. They also help to keep the lower back flexible. Doing these exercises can help to prevent or lessen existing low back pain. If you have back pain or discomfort, try doing these exercises 2-3 times each day or as told by your health care provider. As your pain improves, do them once each day, but increase the number of times that you repeat the steps for each exercise (do more repetitions). To prevent the recurrence of back pain, continue to do these exercises once each day or as told by your health care provider.  Do exercises exactly as told by your health care provider and adjust them as directed. It is normal to feel mild stretching, pulling, tightness, or discomfort as you do these exercises, but you should stop right away if you feel sudden pain or your pain gets worse. Exercises Single knee to chest Repeat these steps 3-5 times for each leg: Lie on your back on a firm bed or the floor with your legs extended. Bring one knee to your chest. Your other leg should stay extended and in contact with the floor. Hold your knee in place by grabbing your  knee or thigh with both hands and hold. Pull on your knee until you feel a gentle stretch in your lower back or buttocks. Hold the stretch for 10-30 seconds. Slowly release and straighten your leg.  Pelvic tilt Repeat these steps 5-10 times: Lie on your back on a firm bed or the floor with your legs extended. Bend your knees so they are pointing toward the ceiling and your feet are flat on the floor. Tighten your lower abdominal muscles to press your lower back against the floor. This motion will tilt your pelvis so your tailbone points up toward the ceiling instead of pointing to your feet or the floor. With gentle tension and even breathing, hold this position for 5-10 seconds.  Cat-cow Repeat these steps until your lower back becomes more flexible: Get into a hands-and-knees position on a firm bed or the floor. Keep your hands under your shoulders, and keep your knees under your hips. You may place padding under your knees for comfort. Let your head hang down toward your chest. Contract your abdominal muscles and point your tailbone toward the floor so your lower back becomes rounded like the back of a cat. Hold this position for 5 seconds. Slowly lift your head, let your abdominal muscles relax, and point your tailbone up toward the ceiling so your back forms a sagging arch like the back of a cow. Hold this position for 5 seconds.  Press-ups Repeat these steps 5-10 times: Lie on your abdomen (face-down) on a firm bed or the floor. Place your palms near your head, about shoulder-width apart. Keeping your back as relaxed as possible and keeping your hips on the floor, slowly straighten your arms to raise the top half of your body and lift your shoulders. Do not use your back muscles to raise your upper torso. You may adjust the placement of your hands to make yourself more comfortable. Hold this position for 5 seconds while you keep your back relaxed. Slowly return to lying flat on the  floor.  Bridges Repeat these steps 10 times: Lie on your back on a firm bed or the floor. Bend your knees so they are pointing toward the ceiling and your feet are flat on the floor. Your arms should be flat at your sides, next to your body. Tighten your buttocks muscles and lift your buttocks off the floor until your waist is at almost the same height as your knees. You should feel the muscles working in your buttocks and the back of your thighs. If you do not feel these muscles, slide your feet 1-2 inches (2.5-5 cm) farther away from your buttocks. Hold this position for 3-5 seconds. Slowly lower your hips to the starting position, and allow your buttocks muscles to relax completely. If this exercise is too easy, try doing it with your arms crossed over your chest. Abdominal crunches Repeat these steps 5-10 times: Anselmo  on your back on a firm bed or the floor with your legs extended. Bend your knees so they are pointing toward the ceiling and your feet are flat on the floor. Cross your arms over your chest. Tip your chin slightly toward your chest without bending your neck. Tighten your abdominal muscles and slowly raise your torso high enough to lift your shoulder blades a tiny bit off the floor. Avoid raising your torso higher than that because it can put too much stress on your lower back and does not help to strengthen your abdominal muscles. Slowly return to your starting position.  Back lifts Repeat these steps 5-10 times: Lie on your abdomen (face-down) with your arms at your sides, and rest your forehead on the floor. Tighten the muscles in your legs and your buttocks. Slowly lift your chest off the floor while you keep your hips pressed to the floor. Keep the back of your head in line with the curve in your back. Your eyes should be looking at the floor. Hold this position for 3-5 seconds. Slowly return to your starting position.  Contact a health care provider if: Your back pain  or discomfort gets much worse when you do an exercise. Your worsening back pain or discomfort does not lessen within 2 hours after you exercise. If you have any of these problems, stop doing these exercises right away. Do not do them again unless your health care provider says that you can. Get help right away if: You develop sudden, severe back pain. If this happens, stop doing the exercises right away. Do not do them again unless your health care provider says that you can. This information is not intended to replace advice given to you by your health care provider. Make sure you discuss any questions you have with your health care provider. Document Revised: 10/27/2022 Document Reviewed: 12/06/2020 Elsevier Patient Education  2024 [employer]   If you have been instructed to have an in-person evaluation today at a local Urgent Care facility, please use the link below. It will take you to a list of all of our available [facility], including address, phone number and hours of operation. Please do not delay care.  [facility]  If you or a family member do not have a primary care provider, use the link below to schedule a visit and establish care. When you choose a [facility] primary care physician or advanced practice provider, you gain a long-term partner in health. Find a Primary Care Provider  Learn more about 's in-office and virtual care options:  - Get Care Now

## 2024-08-14 DIAGNOSIS — F331 Major depressive disorder, recurrent, moderate: Secondary | ICD-10-CM | POA: Diagnosis not present

## 2024-08-25 DIAGNOSIS — F331 Major depressive disorder, recurrent, moderate: Secondary | ICD-10-CM | POA: Diagnosis not present

## 2024-09-08 ENCOUNTER — Telehealth: Admitting: Physician Assistant

## 2024-09-08 DIAGNOSIS — R110 Nausea: Secondary | ICD-10-CM

## 2024-09-08 DIAGNOSIS — R519 Headache, unspecified: Secondary | ICD-10-CM

## 2024-09-08 MED ORDER — ONDANSETRON HCL 4 MG PO TABS: 4.0000 mg | ORAL_TABLET | Freq: Three times a day (TID) | ORAL | 0 refills | Status: AC | PRN

## 2024-09-08 MED ORDER — NAPROXEN 500 MG PO TABS: 500.0000 mg | ORAL_TABLET | Freq: Two times a day (BID) | ORAL | 0 refills | Status: AC

## 2024-09-08 NOTE — Patient Instructions (Signed)
 Cathy Joseph, thank you for joining Lovette Borg, PA-C for today's virtual visit.  While this provider is not your primary care provider (PCP), if your PCP is located in our provider database this encounter information will be shared with them immediately following your visit.   A [facility] MyChart account gives you access to today's visit and all your visits, tests, and labs performed at [facility]  click here if you don't have a [facility] MyChart account or go to [email or web address]  Consent: (Patient) Cathy Joseph provided verbal consent for this virtual visit at the beginning of the encounter.  Current Medications:  Current Outpatient Medications:    naproxen (NAPROSYN) 500 MG tablet, Take 1 tablet (500 mg total) by mouth 2 (two) times daily with a meal., Disp: 30 tablet, Rfl: 0   ondansetron  (ZOFRAN ) 4 MG tablet, Take 1 tablet (4 mg total) by mouth every 8 (eight) hours as needed for nausea or vomiting., Disp: 20 tablet, Rfl: 0   acetaminophen  (TYLENOL ) 500 MG tablet, Take 500 mg by mouth every 6 (six) hours as needed for mild pain., Disp: , Rfl:    cyclobenzaprine  (FLEXERIL ) 10 MG tablet, Take 0.5-1 tablets (5-10 mg total) by mouth 3 (three) times daily as needed., Disp: 30 tablet, Rfl: 0   famotidine  (PEPCID ) 20 MG tablet, Take 1 tablet (20 mg total) by mouth 2 (two) times daily., Disp: 20 tablet, Rfl: 0   ibuprofen  (ADVIL ) 800 MG tablet, Take 1 tablet (800 mg total) by mouth every 8 (eight) hours as needed., Disp: 30 tablet, Rfl: 0   levonorgestrel (LILETTA) 19.5 MCG/DAY IUD IUD, 1 each by Intrauterine route once. Placed in beginning of 2021 (Patient not taking: Reported on 10/14/2021), Disp: , Rfl:    nitrofurantoin , macrocrystal-monohydrate, (MACROBID ) 100 MG capsule, Take 1 capsule (100 mg total) by mouth 2 (two) times daily., Disp: 10 capsule, Rfl: 0   ondansetron  (ZOFRAN -ODT) 4 MG disintegrating tablet, Take 1-2 tablets (4-8 mg total) by mouth every 8  (eight) hours as needed., Disp: 20 tablet, Rfl: 0   sertraline  (ZOLOFT ) 50 MG tablet, Take 1 tablet (50 mg total) by mouth daily. (Patient not taking: Reported on 10/18/2020), Disp: 30 tablet, Rfl: 11   Medications ordered in this encounter:  Meds ordered this encounter  Medications   naproxen (NAPROSYN) 500 MG tablet    Sig: Take 1 tablet (500 mg total) by mouth 2 (two) times daily with a meal.    Dispense:  30 tablet    Refill:  0    Supervising Provider:   LAMPTEY, PHILIP O [[id number]]   ondansetron  (ZOFRAN ) 4 MG tablet    Sig: Take 1 tablet (4 mg total) by mouth every 8 (eight) hours as needed for nausea or vomiting.    Dispense:  20 tablet    Refill:  0    Supervising Provider:   LAMPTEY, PHILIP O [[id number]]     *If you need refills on other medications prior to your next appointment, please contact your pharmacy*  Follow-Up: Call back or seek an in-person evaluation if the symptoms worsen or if the condition fails to improve as anticipated.   Virtual Care [phone]  Other Instructions Increase fluids Start medicine as prescribed. Continue to watch for worsening symptoms. Schedule a virtual appointment or follow up at an urgent care clinic if symptoms don't improve   If you have been instructed to have an in-person evaluation today at a local Urgent Care facility, please use the link  below. It will take you to a list of all of our available [facility], including address, phone number and hours of operation. Please do not delay care.  [facility]  If you or a family member do not have a primary care provider, use the link below to schedule a visit and establish care. When you choose a [facility] primary care physician or advanced practice provider, you gain a long-term partner in health. Find a Primary Care Provider  Learn more about [facility] in-office and virtual care options: [facility] - Get Care Now

## 2024-09-08 NOTE — Progress Notes (Signed)
 Virtual Visit Consent   Cathy Joseph, you are scheduled for a virtual visit with a [facility] provider today. Just as with appointments in the office, your consent must be obtained to participate. Your consent will be active for this visit and any virtual visit you may have with one of our providers in the next 365 days. If you have a MyChart account, a copy of this consent can be sent to you electronically.  As this is a virtual visit, video technology does not allow for your provider to perform a traditional examination. This may limit your provider's ability to fully assess your condition. If your provider identifies any concerns that need to be evaluated in person or the need to arrange testing (such as labs, EKG, etc.), we will make arrangements to do so. Although advances in technology are sophisticated, we cannot ensure that it will always work on either your end or our end. If the connection with a video visit is poor, the visit may have to be switched to a telephone visit. With either a video or telephone visit, we are not always able to ensure that we have a secure connection.  By engaging in this virtual visit, you consent to the provision of healthcare and authorize for your insurance to be billed (if applicable) for the services provided during this visit. Depending on your insurance coverage, you may receive a charge related to this service.  I need to obtain your verbal consent now. Are you willing to proceed with your visit today? Cathy Joseph has provided verbal consent on 09/08/2024 for a virtual visit (video or telephone). Cathy Borg, PA-C  Date: 09/08/2024 5:58 PM   Virtual Visit via Video Note   I, Cathy Joseph, connected with  Cathy Joseph  ([id number], Nov 07, 1990) on 09/08/24 at  5:45 PM EST by a video-enabled telemedicine application and verified that I am speaking with the correct person using two identifiers.  Location: Patient: Virtual Visit Location Patient:  Home Provider: Virtual Visit Location Provider: Home Office   I discussed the limitations of evaluation and management by telemedicine and the availability of in person appointments. The patient expressed understanding and agreed to proceed.    History of Present Illness: Cathy Joseph is a 33 y.o. who identifies as a female who was assigned female at birth, and is being seen today for headache x 2 days.  HPI: 33y/o F presents for a telehealth video visit for c/o  headache x 2 days. No improvement. Has taken Ibuprofen  with only minimal relief. H/o migraines, but Ibuprofen  usually helps. Slight nausea, but no vomiting. No recent MVA or head or neck injuries.     Problems:  Patient Active Problem List   Diagnosis Date Noted   Vaginal bleeding affecting early pregnancy 09/14/2019   Preterm delivery 09/14/2019    Allergies: No Known Allergies Medications:  Current Outpatient Medications:    naproxen (NAPROSYN) 500 MG tablet, Take 1 tablet (500 mg total) by mouth 2 (two) times daily with a meal., Disp: 30 tablet, Rfl: 0   ondansetron  (ZOFRAN ) 4 MG tablet, Take 1 tablet (4 mg total) by mouth every 8 (eight) hours as needed for nausea or vomiting., Disp: 20 tablet, Rfl: 0   acetaminophen  (TYLENOL ) 500 MG tablet, Take 500 mg by mouth every 6 (six) hours as needed for mild pain., Disp: , Rfl:    cyclobenzaprine  (FLEXERIL ) 10 MG tablet, Take 0.5-1 tablets (5-10 mg total) by mouth 3 (three) times daily as needed.,  Disp: 30 tablet, Rfl: 0   famotidine  (PEPCID ) 20 MG tablet, Take 1 tablet (20 mg total) by mouth 2 (two) times daily., Disp: 20 tablet, Rfl: 0   ibuprofen  (ADVIL ) 800 MG tablet, Take 1 tablet (800 mg total) by mouth every 8 (eight) hours as needed., Disp: 30 tablet, Rfl: 0   levonorgestrel (LILETTA) 19.5 MCG/DAY IUD IUD, 1 each by Intrauterine route once. Placed in beginning of 2021 (Patient not taking: Reported on 10/14/2021), Disp: , Rfl:    nitrofurantoin , macrocrystal-monohydrate,  (MACROBID ) 100 MG capsule, Take 1 capsule (100 mg total) by mouth 2 (two) times daily., Disp: 10 capsule, Rfl: 0   ondansetron  (ZOFRAN -ODT) 4 MG disintegrating tablet, Take 1-2 tablets (4-8 mg total) by mouth every 8 (eight) hours as needed., Disp: 20 tablet, Rfl: 0   sertraline  (ZOLOFT ) 50 MG tablet, Take 1 tablet (50 mg total) by mouth daily. (Patient not taking: Reported on 10/18/2020), Disp: 30 tablet, Rfl: 11  Observations/Objective: Patient is well-developed, well-nourished in no acute distress.  Resting comfortably  at home.  Head is normocephalic, atraumatic.  No labored breathing.  Speech is clear and coherent with logical content.  Patient is alert and oriented at baseline.    Assessment and Plan: 1. Headache disorder (Primary) - naproxen (NAPROSYN) 500 MG tablet; Take 1 tablet (500 mg total) by mouth 2 (two) times daily with a meal.  Dispense: 30 tablet; Refill: 0  2. Nausea - ondansetron  (ZOFRAN ) 4 MG tablet; Take 1 tablet (4 mg total) by mouth every 8 (eight) hours as needed for nausea or vomiting.  Dispense: 20 tablet; Refill: 0  Increase fluids Start medicine as prescribed. Continue to watch for worsening symptoms. Schedule a virtual appointment or follow up at an urgent care clinic if symptoms don't improve.  Pt verbalized understanding and in agreement.    Follow Up Instructions: I discussed the assessment and treatment plan with the patient. The patient was provided an opportunity to ask questions and all were answered. The patient agreed with the plan and demonstrated an understanding of the instructions.  A copy of instructions were sent to the patient via MyChart unless otherwise noted below.   Patient has requested to receive PHI (AVS, Work Notes, etc) pertaining to this video visit through e-mail as they are currently without active MyChart. They have voiced understand that email is not considered secure and their health information could be viewed by someone other  than the patient.   The patient was advised to call back or seek an in-person evaluation if the symptoms worsen or if the condition fails to improve as anticipated.    Meziah Blasingame, PA-C

## 2024-09-10 DIAGNOSIS — F331 Major depressive disorder, recurrent, moderate: Secondary | ICD-10-CM | POA: Diagnosis not present

## 2024-09-15 DIAGNOSIS — F331 Major depressive disorder, recurrent, moderate: Secondary | ICD-10-CM | POA: Diagnosis not present

## 2024-09-17 DIAGNOSIS — F331 Major depressive disorder, recurrent, moderate: Secondary | ICD-10-CM | POA: Diagnosis not present

## 2024-09-20 DIAGNOSIS — F331 Major depressive disorder, recurrent, moderate: Secondary | ICD-10-CM | POA: Diagnosis not present

## 2024-09-22 DIAGNOSIS — F331 Major depressive disorder, recurrent, moderate: Secondary | ICD-10-CM | POA: Diagnosis not present

## 2024-09-29 DIAGNOSIS — F331 Major depressive disorder, recurrent, moderate: Secondary | ICD-10-CM | POA: Diagnosis not present

## 2024-10-01 DIAGNOSIS — F331 Major depressive disorder, recurrent, moderate: Secondary | ICD-10-CM | POA: Diagnosis not present

## 2024-10-13 ENCOUNTER — Telehealth: Admitting: Physician Assistant

## 2024-10-13 DIAGNOSIS — B349 Viral infection, unspecified: Secondary | ICD-10-CM | POA: Diagnosis not present

## 2024-10-13 DIAGNOSIS — R519 Headache, unspecified: Secondary | ICD-10-CM | POA: Diagnosis not present

## 2024-10-13 MED ORDER — IBUPROFEN 800 MG PO TABS: 800.0000 mg | ORAL_TABLET | Freq: Three times a day (TID) | ORAL | 0 refills | Status: AC | PRN

## 2024-10-13 MED ORDER — FLUTICASONE PROPIONATE 50 MCG/ACT NA SUSP: 2.0000 | Freq: Every day | NASAL | 0 refills | Status: AC

## 2024-10-13 NOTE — Progress Notes (Signed)
 " Virtual Visit Consent   Cathy Joseph, you are scheduled for a virtual visit with a [facility] provider today. Just as with appointments in the office, your consent must be obtained to participate. Your consent will be active for this visit and any virtual visit you may have with one of our providers in the next 365 days. If you have a MyChart account, a copy of this consent can be sent to you electronically.  As this is a virtual visit, video technology does not allow for your provider to perform a traditional examination. This may limit your provider's ability to fully assess your condition. If your provider identifies any concerns that need to be evaluated in person or the need to arrange testing (such as labs, EKG, etc.), we will make arrangements to do so. Although advances in technology are sophisticated, we cannot ensure that it will always work on either your end or our end. If the connection with a video visit is poor, the visit may have to be switched to a telephone visit. With either a video or telephone visit, we are not always able to ensure that we have a secure connection.  By engaging in this virtual visit, you consent to the provision of healthcare and authorize for your insurance to be billed (if applicable) for the services provided during this visit. Depending on your insurance coverage, you may receive a charge related to this service.  I need to obtain your verbal consent now. Are you willing to proceed with your visit today? Cathy Joseph has provided verbal consent on 10/13/2024 for a virtual visit (video or telephone). Delon CHRISTELLA Dickinson, PA-C  Date: 10/13/2024 12:30 PM   Virtual Visit via Video Note   I, Delon CHRISTELLA Dickinson, connected with  Cathy Joseph  ([id number], [birth year]) on 10/13/2024 at 12:15 PM EST by a video-enabled telemedicine application and verified that I am speaking with the correct person using two identifiers.  Location: Patient: Virtual Visit  Location Patient: Mobile Provider: Virtual Visit Location Provider: Home Office   I discussed the limitations of evaluation and management by telemedicine and the availability of in person appointments. The patient expressed understanding and agreed to proceed.    History of Present Illness: Cathy Joseph is a 34 y.o. who identifies as a female who was assigned female at birth, and is being seen today for sinus congestion and sinus headache.  HPI: Headache  This is a new problem. The current episode started in the past 7 days (Started Friday/Saturday then improved but returned yesterday). The pain is located in the Retro-orbital region. The pain radiates to the left neck. The pain quality is similar to prior headaches. The quality of the pain is described as aching. The pain is mild. Associated symptoms include neck pain (mild on the left), photophobia (mild), rhinorrhea, sinus pressure and a sore throat. Pertinent negatives include no blurred vision, coughing, dizziness, ear pain, fever, hearing loss, loss of balance, nausea, numbness, phonophobia, tingling, tinnitus, visual change, vomiting or weakness. Nothing aggravates the symptoms. She has tried acetaminophen  and NSAIDs for the symptoms. The treatment provided no relief. Her past medical history is significant for migraine headaches.     Problems:  Patient Active Problem List   Diagnosis Date Noted   Vaginal bleeding affecting early pregnancy 09/14/2019   Preterm delivery 09/14/2019    Allergies: Allergies[1] Medications: Current Medications[2]  Observations/Objective: Patient is well-developed, well-nourished in no acute distress.  Resting comfortably at home.  Head is  normocephalic, atraumatic.  No labored breathing.  Speech is clear and coherent with logical content.  Patient is alert and oriented at baseline.    Assessment and Plan: 1. Viral illness (Primary) - fluticasone  (FLONASE ) 50 MCG/ACT nasal spray; Place 2 sprays  into both nostrils daily.  Dispense: 16 g; Refill: 0 - ibuprofen  (ADVIL ) 800 MG tablet; Take 1 tablet (800 mg total) by mouth every 8 (eight) hours as needed.  Dispense: 30 tablet; Refill: 0  2. Sinus headache  - Suspect viral illness/URI causing headache - Add Flonase  as above for congestion and drainage - Add Ibuprofen  800mg  for headache and neck stiffness - Push fluids - Work note provided - Seek further evaluation if worsening or fails to improve  Follow Up Instructions: I discussed the assessment and treatment plan with the patient. The patient was provided an opportunity to ask questions and all were answered. The patient agreed with the plan and demonstrated an understanding of the instructions.  A copy of instructions were sent to the patient via MyChart unless otherwise noted below.    The patient was advised to call back or seek an in-person evaluation if the symptoms worsen or if the condition fails to improve as anticipated.    Delon HERO Coren Sagan, PA-C     [1] No Known Allergies [2]  Current Outpatient Medications:    fluticasone  (FLONASE ) 50 MCG/ACT nasal spray, Place 2 sprays into both nostrils daily., Disp: 16 g, Rfl: 0   ibuprofen  (ADVIL ) 800 MG tablet, Take 1 tablet (800 mg total) by mouth every 8 (eight) hours as needed., Disp: 30 tablet, Rfl: 0   acetaminophen  (TYLENOL ) 500 MG tablet, Take 500 mg by mouth every 6 (six) hours as needed for mild pain., Disp: , Rfl:    cyclobenzaprine  (FLEXERIL ) 10 MG tablet, Take 0.5-1 tablets (5-10 mg total) by mouth 3 (three) times daily as needed., Disp: 30 tablet, Rfl: 0   famotidine  (PEPCID ) 20 MG tablet, Take 1 tablet (20 mg total) by mouth 2 (two) times daily., Disp: 20 tablet, Rfl: 0   levonorgestrel (LILETTA) 19.5 MCG/DAY IUD IUD, 1 each by Intrauterine route once. Placed in beginning of 2021 (Patient not taking: Reported on 10/14/2021), Disp: , Rfl:    naproxen  (NAPROSYN ) 500 MG tablet, Take 1 tablet (500 mg total) by mouth 2  (two) times daily with a meal., Disp: 30 tablet, Rfl: 0   nitrofurantoin , macrocrystal-monohydrate, (MACROBID ) 100 MG capsule, Take 1 capsule (100 mg total) by mouth 2 (two) times daily., Disp: 10 capsule, Rfl: 0   ondansetron  (ZOFRAN ) 4 MG tablet, Take 1 tablet (4 mg total) by mouth every 8 (eight) hours as needed for nausea or vomiting., Disp: 20 tablet, Rfl: 0   ondansetron  (ZOFRAN -ODT) 4 MG disintegrating tablet, Take 1-2 tablets (4-8 mg total) by mouth every 8 (eight) hours as needed., Disp: 20 tablet, Rfl: 0   sertraline  (ZOLOFT ) 50 MG tablet, Take 1 tablet (50 mg total) by mouth daily. (Patient not taking: Reported on 10/18/2020), Disp: 30 tablet, Rfl: 11  "

## 2024-10-13 NOTE — Patient Instructions (Signed)
 " Cathy Joseph, thank you for joining Delon CHRISTELLA Dickinson, PA-C for today's virtual visit.  While this provider is not your primary care provider (PCP), if your PCP is located in our provider database this encounter information will be shared with them immediately following your visit.   A [facility] MyChart account gives you access to today's visit and all your visits, tests, and labs performed at [facility]  click here if you don't have a [facility] MyChart account or go to [email or web address]  Consent: (Patient) Cathy Joseph provided verbal consent for this virtual visit at the beginning of the encounter.  Current Medications:  Current Outpatient Medications:    fluticasone  (FLONASE ) 50 MCG/ACT nasal spray, Place 2 sprays into both nostrils daily., Disp: 16 g, Rfl: 0   ibuprofen  (ADVIL ) 800 MG tablet, Take 1 tablet (800 mg total) by mouth every 8 (eight) hours as needed., Disp: 30 tablet, Rfl: 0   acetaminophen  (TYLENOL ) 500 MG tablet, Take 500 mg by mouth every 6 (six) hours as needed for mild pain., Disp: , Rfl:    cyclobenzaprine  (FLEXERIL ) 10 MG tablet, Take 0.5-1 tablets (5-10 mg total) by mouth 3 (three) times daily as needed., Disp: 30 tablet, Rfl: 0   famotidine  (PEPCID ) 20 MG tablet, Take 1 tablet (20 mg total) by mouth 2 (two) times daily., Disp: 20 tablet, Rfl: 0   levonorgestrel (LILETTA) 19.5 MCG/DAY IUD IUD, 1 each by Intrauterine route once. Placed in beginning of 2021 (Patient not taking: Reported on 10/14/2021), Disp: , Rfl:    naproxen  (NAPROSYN ) 500 MG tablet, Take 1 tablet (500 mg total) by mouth 2 (two) times daily with a meal., Disp: 30 tablet, Rfl: 0   nitrofurantoin , macrocrystal-monohydrate, (MACROBID ) 100 MG capsule, Take 1 capsule (100 mg total) by mouth 2 (two) times daily., Disp: 10 capsule, Rfl: 0   ondansetron  (ZOFRAN ) 4 MG tablet, Take 1 tablet (4 mg total) by mouth every 8 (eight) hours as needed for nausea or vomiting., Disp: 20 tablet,  Rfl: 0   ondansetron  (ZOFRAN -ODT) 4 MG disintegrating tablet, Take 1-2 tablets (4-8 mg total) by mouth every 8 (eight) hours as needed., Disp: 20 tablet, Rfl: 0   sertraline  (ZOLOFT ) 50 MG tablet, Take 1 tablet (50 mg total) by mouth daily. (Patient not taking: Reported on 10/18/2020), Disp: 30 tablet, Rfl: 11   Medications ordered in this encounter:  Meds ordered this encounter  Medications   fluticasone  (FLONASE ) 50 MCG/ACT nasal spray    Sig: Place 2 sprays into both nostrils daily.    Dispense:  16 g    Refill:  0    Supervising Provider:   BLAISE ALEENE KIDD [[id number]]   ibuprofen  (ADVIL ) 800 MG tablet    Sig: Take 1 tablet (800 mg total) by mouth every 8 (eight) hours as needed.    Dispense:  30 tablet    Refill:  0    Supervising Provider:   LAMPTEY, PHILIP O [[id number]]     *If you need refills on other medications prior to your next appointment, please contact your pharmacy*  Follow-Up: Call back or seek an in-person evaluation if the symptoms worsen or if the condition fails to improve as anticipated.  [facility] [phone]  Other Instructions  Sinus Pain  Sinus pain may occur when your sinuses become clogged or swollen. Sinuses are air-filled spaces in your skull that are behind the bones of your face and forehead. Sinus pain can range from mild to severe.  What are the causes? Sinus pain can result from various conditions that affect the sinuses. Common causes include: Colds. Sinus infections. Allergies. What are the signs or symptoms? The main symptom of this condition is pain or pressure in your face, forehead, ears, or upper teeth. People who have sinus pain often have other symptoms, such as: Congested or runny nose. Fever. Inability to smell. Headache. Weather changes can make symptoms worse. How is this diagnosed? Your health care provider will diagnose this condition based on your symptoms and a physical exam. If you have pain that keeps  coming back or does not go away, your health care provider may recommend more testing. This may include: Imaging tests, such as a CT scan or MRI, to check for problems with your sinuses. Examination of your sinuses using a thin tool with a camera that is inserted through your nose (endoscopy). How is this treated? Treatment for this condition depends on the cause. Sinus pain that is caused by a sinus infection may be treated with antibiotic medicine. Sinus pain that is caused by congestion may be helped by rinsing out (flushing) the nose and sinuses with saline solution. Sinus pain that is caused by allergies may be helped by allergy medicines (antihistamines) and medicated nasal sprays. Sinus surgery may be needed in some cases if other treatments do not help. Follow these instructions at home: General instructions If directed: Apply a warm, moist washcloth to your face to help relieve pain. Use a nasal saline wash. Follow the directions on the bottle or box. Hydrate and humidify Drink enough water to keep your urine clear or pale yellow. Staying hydrated will help to thin your mucus. Use a humidifier if your home is dry. Inhale steam for 10-15 minutes, 3-4 times a day or as told by your health care provider. You can do this in the bathroom while a hot shower is running. Limit your exposure to cool or dry air. Medicines  Take over-the-counter and prescription medicines only as told by your health care provider. If you were prescribed an antibiotic medicine, take it as told by your health care provider. Do not stop taking the antibiotic even if you start to feel better. If you have congestion, use a nasal spray to help lessen pressure. Contact a health care provider if: You have sinus pain more than one time a week. You have sensitivity to light or sound. You develop a fever. You feel nauseous or you vomit. Your sinus pain or headache does not get better with treatment. Get help right  away if: You have vision problems. You have sudden, severe pain in your face or head. You have a seizure. You are confused. You have a stiff neck. Summary Sinus pain occurs when your sinuses become clogged or swollen. Sinus pain can result from various conditions that affect the sinuses, such as a cold, a sinus infection, or an allergy. Treatment for this condition depends on the cause. It may include medicine, such as antibiotics or antihistamines. This information is not intended to replace advice given to you by your health care provider. Make sure you discuss any questions you have with your health care provider. Document Revised: 08/26/2021 Document Reviewed: 08/26/2021 Elsevier Patient Education  2024 [employer]   If you have been instructed to have an in-person evaluation today at a local Urgent Care facility, please use the link below. It will take you to a list of all of our available [facility], including address, phone number  and hours of operation. Please do not delay care.  [facility]  If you or a family member do not have a primary care provider, use the link below to schedule a visit and establish care. When you choose a [facility] primary care physician or advanced practice provider, you gain a long-term partner in health. Find a Primary Care Provider  Learn more about [facility]'s in-office and virtual care options: [facility] - Get Care Now "

## 2024-10-14 ENCOUNTER — Telehealth: Admitting: Family Medicine

## 2024-10-14 DIAGNOSIS — R519 Headache, unspecified: Secondary | ICD-10-CM | POA: Diagnosis not present

## 2024-10-14 NOTE — Progress Notes (Signed)
 " Virtual Visit Consent   [name], you are scheduled for a virtual visit with a [facility] provider today. Just as with appointments in the office, your consent must be obtained to participate. Your consent will be active for this visit and any virtual visit you may have with one of our providers in the next 365 days. If you have a MyChart account, a copy of this consent can be sent to you electronically.  As this is a virtual visit, video technology does not allow for your provider to perform a traditional examination. This may limit your provider's ability to fully assess your condition. If your provider identifies any concerns that need to be evaluated in person or the need to arrange testing (such as labs, EKG, etc.), we will make arrangements to do so. Although advances in technology are sophisticated, we cannot ensure that it will always work on either your end or our end. If the connection with a video visit is poor, the visit may have to be switched to a telephone visit. With either a video or telephone visit, we are not always able to ensure that we have a secure connection.  By engaging in this virtual visit, you consent to the provision of healthcare and authorize for your insurance to be billed (if applicable) for the services provided during this visit. Depending on your insurance coverage, you may receive a charge related to this service.  I need to obtain your verbal consent now. Are you willing to proceed with your visit today? Catalia ZARIAH CAVENDISH has provided verbal consent on 10/14/2024 for a virtual visit (video or telephone). Loa Lamp, FNP  Date: 10/14/2024 6:05 PM   Virtual Visit via Video Note   I, Loa Lamp, connected with  Cathy Joseph  ([id number], [birth year]) on 10/14/2024 at  6:00 PM EST by a video-enabled telemedicine application and verified that I am speaking with the correct person using two identifiers.  Location: Patient: Virtual Visit Location Patient:  Home Provider: Virtual Visit Location Provider: Home Office   I discussed the limitations of evaluation and management by telemedicine and the availability of in person appointments. The patient expressed understanding and agreed to proceed.    History of Present Illness: Cathy Joseph is a 34 y.o. who identifies as a female who was assigned female at birth, and is being seen today for migraine yesterday, has one today also. Feels swimmy in her head. She took ibuprofen . No history of migraines. Nausea or vomiting. No fever or chills. Headache gone now but she needs a note for work.   HPI: HPI  Problems:  Patient Active Problem List   Diagnosis Date Noted   Vaginal bleeding affecting early pregnancy 09/14/2019   Preterm delivery 09/14/2019    Allergies: Allergies[1] Medications: Current Medications[2]  Observations/Objective: Patient is well-developed, well-nourished in no acute distress.  Resting comfortably  at home.  Head is normocephalic, atraumatic.  No labored breathing.  Speech is clear and coherent with logical content.  Patient is alert and oriented at baseline.    Assessment and Plan: 1. Headache disorder (Primary)  Urgent care if headache returns. Follow up with pcp.   Follow Up Instructions: I discussed the assessment and treatment plan with the patient. The patient was provided an opportunity to ask questions and all were answered. The patient agreed with the plan and demonstrated an understanding of the instructions.  A copy of instructions were sent to the patient via MyChart unless otherwise noted below.  The patient was advised to call back or seek an in-person evaluation if the symptoms worsen or if the condition fails to improve as anticipated.    [name], FNP     [1] No Known Allergies [2]  Current Outpatient Medications:    acetaminophen  (TYLENOL ) 500 MG tablet, Take 500 mg by mouth every 6 (six) hours as needed for mild pain., Disp: , Rfl:     cyclobenzaprine  (FLEXERIL ) 10 MG tablet, Take 0.5-1 tablets (5-10 mg total) by mouth 3 (three) times daily as needed., Disp: 30 tablet, Rfl: 0   famotidine  (PEPCID ) 20 MG tablet, Take 1 tablet (20 mg total) by mouth 2 (two) times daily., Disp: 20 tablet, Rfl: 0   fluticasone  (FLONASE ) 50 MCG/ACT nasal spray, Place 2 sprays into both nostrils daily., Disp: 16 g, Rfl: 0   ibuprofen  (ADVIL ) 800 MG tablet, Take 1 tablet (800 mg total) by mouth every 8 (eight) hours as needed., Disp: 30 tablet, Rfl: 0   levonorgestrel (LILETTA) 19.5 MCG/DAY IUD IUD, 1 each by Intrauterine route once. Placed in beginning of 2021 (Patient not taking: Reported on 10/14/2021), Disp: , Rfl:    naproxen  (NAPROSYN ) 500 MG tablet, Take 1 tablet (500 mg total) by mouth 2 (two) times daily with a meal., Disp: 30 tablet, Rfl: 0   nitrofurantoin , macrocrystal-monohydrate, (MACROBID ) 100 MG capsule, Take 1 capsule (100 mg total) by mouth 2 (two) times daily., Disp: 10 capsule, Rfl: 0   ondansetron  (ZOFRAN ) 4 MG tablet, Take 1 tablet (4 mg total) by mouth every 8 (eight) hours as needed for nausea or vomiting., Disp: 20 tablet, Rfl: 0   ondansetron  (ZOFRAN -ODT) 4 MG disintegrating tablet, Take 1-2 tablets (4-8 mg total) by mouth every 8 (eight) hours as needed., Disp: 20 tablet, Rfl: 0   sertraline  (ZOLOFT ) 50 MG tablet, Take 1 tablet (50 mg total) by mouth daily. (Patient not taking: Reported on 10/18/2020), Disp: 30 tablet, Rfl: 11  "

## 2024-10-14 NOTE — Patient Instructions (Signed)
 General Headache Without Cause A headache is pain or discomfort felt around the head or neck area. There are many causes and types of headaches. A few common types include: Tension headaches. Migraine headaches. Cluster headaches. Chronic daily headaches. Sometimes, the specific cause of a headache may not be found. Follow these instructions at home: Watch your condition for any changes. Let your health care provider know about them. Take these steps to help with your condition: Managing pain     Take over-the-counter and prescription medicines only as told by your health care provider. Treatment may include medicines for pain that are taken by mouth or applied to the skin. Lie down in a dark, quiet room when you have a headache. Keep lights dim if bright lights bother you or make your headaches worse. If directed, put ice on your head and neck area: Put ice in a plastic bag. Place a towel between your skin and the bag. Leave the ice on for 20 minutes, 2-3 times per day. Remove the ice if your skin turns bright red. This is very important. If you cannot feel pain, heat, or cold, you have a greater risk of damage to the area. If directed, apply heat to the affected area. Use the heat source that your health care provider recommends, such as a moist heat pack or a heating pad. Place a towel between your skin and the heat source. Leave the heat on for 20-30 minutes. Remove the heat if your skin turns bright red. This is especially important if you are unable to feel pain, heat, or cold. You have a greater risk of getting burned. Eating and drinking Eat meals on a regular schedule. If you drink alcohol: Limit how much you have to: 0-1 drink a day for women who are not pregnant. 0-2 drinks a day for men. Know how much alcohol is in a drink. In the [place] one drink equals one 12 oz bottle of beer (355 mL), one 5 oz glass of wine (148 mL), or one 1 oz glass of hard liquor (44 mL). Stop  drinking caffeine, or decrease the amount of caffeine you drink. Drink enough fluid to keep your urine pale yellow. General instructions  Keep a headache journal to help find out what may trigger your headaches. For example, write down: What you eat and drink. How much sleep you get. Any change to your diet or medicines. Try massage or other relaxation techniques. Limit stress. Sit up straight, and do not tense your muscles. Do not use any products that contain nicotine or tobacco. These products include cigarettes, chewing tobacco, and vaping devices, such as e-cigarettes. If you need help quitting, ask your health care provider. Exercise regularly as told by your health care provider. Sleep on a regular schedule. Get 7-9 hours of sleep each night, or the amount recommended by your health care provider. Keep all follow-up visits. This is important. Contact a health care provider if: Medicine does not help your symptoms. You have a headache that is different from your usual headache. You have nausea or you vomit. You have a fever. Get help right away if: Your headache: Becomes severe quickly. Gets worse after moderate to intense physical activity. You have any of these symptoms: Repeated vomiting. Pain or stiffness in your neck. Changes to your vision. Pain in an eye or ear. Problems with speech. Muscular weakness or loss of muscle control. Loss of balance or coordination. You feel faint or pass out. You have confusion. You have  a seizure. These symptoms may represent a serious problem that is an emergency. Do not wait to see if the symptoms will go away. Get medical help right away. Call your local emergency services (911 in the [place] Do not drive yourself to the hospital. Summary A headache is pain or discomfort felt around the head or neck area. There are many causes and types of headaches. In some cases, the cause may not be found. Keep a headache journal to help find out  what may trigger your headaches. Watch your condition for any changes. Let your health care provider know about them. Contact a health care provider if you have a headache that is different from the usual headache, or if your symptoms are not helped by medicine. Get help right away if your headache becomes severe, you vomit, you have a loss of vision, you lose your balance, or you have a seizure. This information is not intended to replace advice given to you by your health care provider. Make sure you discuss any questions you have with your health care provider. Document Revised: 02/21/2021 Document Reviewed: 02/21/2021 [employer]vier Patient Education  2024 [employer].
